# Patient Record
Sex: Female | Born: 1980 | Race: Black or African American | Hispanic: No | Marital: Married | Smoking: Never smoker
Health system: Southern US, Community
[De-identification: ages and names within clinical notes are randomized; demographics above are authoritative.]

## PROBLEM LIST (undated history)

## (undated) DIAGNOSIS — O24419 Gestational diabetes mellitus in pregnancy, unspecified control: Secondary | ICD-10-CM

## (undated) HISTORY — PX: FOOT SURGERY: SHX648

---

## 2007-01-14 ENCOUNTER — Ambulatory Visit: Payer: Self-pay | Admitting: *Deleted

## 2007-01-14 ENCOUNTER — Ambulatory Visit (HOSPITAL_COMMUNITY): Admission: RE | Admit: 2007-01-14 | Discharge: 2007-01-14 | Payer: Self-pay | Admitting: Family Medicine

## 2007-01-16 ENCOUNTER — Ambulatory Visit: Payer: Self-pay | Admitting: Gynecology

## 2007-01-21 ENCOUNTER — Ambulatory Visit: Payer: Self-pay | Admitting: Obstetrics & Gynecology

## 2007-01-23 ENCOUNTER — Ambulatory Visit: Payer: Self-pay | Admitting: Obstetrics and Gynecology

## 2007-01-25 ENCOUNTER — Inpatient Hospital Stay (HOSPITAL_COMMUNITY): Admission: AD | Admit: 2007-01-25 | Discharge: 2007-01-27 | Payer: Self-pay | Admitting: Obstetrics & Gynecology

## 2007-01-25 ENCOUNTER — Ambulatory Visit: Payer: Self-pay | Admitting: Family Medicine

## 2007-03-05 ENCOUNTER — Ambulatory Visit: Payer: Self-pay | Admitting: Gynecology

## 2007-03-05 ENCOUNTER — Encounter (INDEPENDENT_AMBULATORY_CARE_PROVIDER_SITE_OTHER): Payer: Self-pay | Admitting: Gynecology

## 2007-04-22 ENCOUNTER — Ambulatory Visit: Payer: Self-pay | Admitting: Obstetrics and Gynecology

## 2008-05-17 ENCOUNTER — Other Ambulatory Visit: Admission: RE | Admit: 2008-05-17 | Discharge: 2008-05-17 | Payer: Self-pay | Admitting: Family Medicine

## 2008-05-23 ENCOUNTER — Ambulatory Visit (HOSPITAL_COMMUNITY): Admission: RE | Admit: 2008-05-23 | Discharge: 2008-05-23 | Payer: Self-pay | Admitting: Family Medicine

## 2009-06-30 ENCOUNTER — Other Ambulatory Visit: Admission: RE | Admit: 2009-06-30 | Discharge: 2009-06-30 | Payer: Self-pay | Admitting: Family Medicine

## 2010-05-20 ENCOUNTER — Ambulatory Visit: Payer: Self-pay | Admitting: Obstetrics and Gynecology

## 2010-05-20 ENCOUNTER — Inpatient Hospital Stay (HOSPITAL_COMMUNITY): Admission: AD | Admit: 2010-05-20 | Discharge: 2010-05-20 | Payer: Self-pay | Admitting: Obstetrics and Gynecology

## 2010-05-22 ENCOUNTER — Inpatient Hospital Stay (HOSPITAL_COMMUNITY): Admission: AD | Admit: 2010-05-22 | Discharge: 2010-05-22 | Payer: Self-pay | Admitting: Obstetrics & Gynecology

## 2010-05-22 ENCOUNTER — Ambulatory Visit: Payer: Self-pay | Admitting: Nurse Practitioner

## 2010-10-02 DIAGNOSIS — O24419 Gestational diabetes mellitus in pregnancy, unspecified control: Secondary | ICD-10-CM

## 2010-10-02 HISTORY — DX: Gestational diabetes mellitus in pregnancy, unspecified control: O24.419

## 2010-11-08 LAB — URINALYSIS, ROUTINE W REFLEX MICROSCOPIC
Glucose, UA: NEGATIVE mg/dL
Leukocytes, UA: NEGATIVE
Protein, ur: NEGATIVE mg/dL
pH: 7 (ref 5.0–8.0)

## 2010-11-08 LAB — URINE MICROSCOPIC-ADD ON

## 2010-11-08 LAB — POCT PREGNANCY, URINE: Preg Test, Ur: POSITIVE

## 2010-11-08 LAB — CBC
Hemoglobin: 13.9 g/dL (ref 12.0–15.0)
MCH: 30.3 pg (ref 26.0–34.0)
MCHC: 33.8 g/dL (ref 30.0–36.0)
Platelets: 237 10*3/uL (ref 150–400)
RDW: 13.5 % (ref 11.5–15.5)

## 2010-11-08 LAB — GC/CHLAMYDIA PROBE AMP, GENITAL
Chlamydia, DNA Probe: NEGATIVE
GC Probe Amp, Genital: NEGATIVE

## 2010-11-08 LAB — HCG, QUANTITATIVE, PREGNANCY: hCG, Beta Chain, Quant, S: 1597 m[IU]/mL — ABNORMAL HIGH (ref <5)

## 2010-11-08 LAB — ABO/RH: ABO/RH(D): B POS

## 2011-01-04 LAB — HEPATITIS B SURFACE ANTIGEN: Hepatitis B Surface Ag: NEGATIVE

## 2011-01-04 LAB — HIV ANTIBODY (ROUTINE TESTING W REFLEX): HIV: NONREACTIVE

## 2011-01-04 LAB — ANTIBODY SCREEN: Antibody Screen: NEGATIVE

## 2011-01-08 NOTE — Group Therapy Note (Signed)
NAMETALYSSA, Kristina Clements NO.:  [id number]   MEDICAL RECORD NO.:  [id number]          PATIENT TYPE:  WOC   LOCATION:  [facility]                   FACILITY:  [facility]   PHYSICIAN:  Ginger Carne, MD DATE OF BIRTH:  08-23-1981   DATE OF SERVICE:  03/05/2007                                  CLINIC NOTE   HISTORY:  The patient is here today for a 6-week postpartum visit  following normal spontaneous vaginal delivery.  The patient has no  specific complaints and has scant spotting.  She requested an  intrauterine device.  Her urine pregnancy test is negative.   PHYSICAL EXAMINATION:  PELVIC:  External genitalia, vulva and vagina are  normal.  Cervix is smooth without erosions or lesions.  Pap smear  performed.  IUD placed without.   RECOMMENDATION:  Patient advised to contact the office for difficulties  including increasing cramping or abnormal bleeding.  The patient  understands.  IUD has contraceptive effect for 5 years.           ______________________________  Ginger Carne, MD     SHB/MEDQ  D:  03/05/2007  T:  03/06/2007  Job:  [id number]

## 2011-06-11 LAB — POCT PREGNANCY, URINE: Operator id: 148111

## 2011-06-13 LAB — CBC
HCT: 43
Hemoglobin: 14.4
MCV: 88
Platelets: 178
RDW: 14.8 — ABNORMAL HIGH

## 2011-06-19 ENCOUNTER — Ambulatory Visit: Payer: Self-pay

## 2011-07-22 ENCOUNTER — Observation Stay (HOSPITAL_COMMUNITY)
Admission: AD | Admit: 2011-07-22 | Discharge: 2011-07-23 | Disposition: A | Discharge status: Home / Self Care | Payer: 59 | Source: Ambulatory Visit | Attending: Obstetrics and Gynecology | Admitting: Obstetrics and Gynecology

## 2011-07-22 ENCOUNTER — Encounter (HOSPITAL_COMMUNITY): Payer: Self-pay | Admitting: *Deleted

## 2011-07-22 DIAGNOSIS — O4700 False labor before 37 completed weeks of gestation, unspecified trimester: Secondary | ICD-10-CM | POA: Insufficient documentation

## 2011-07-22 DIAGNOSIS — O99810 Abnormal glucose complicating pregnancy: Secondary | ICD-10-CM | POA: Insufficient documentation

## 2011-07-22 DIAGNOSIS — O3660X0 Maternal care for excessive fetal growth, unspecified trimester, not applicable or unspecified: Secondary | ICD-10-CM | POA: Insufficient documentation

## 2011-07-22 HISTORY — DX: Gestational diabetes mellitus in pregnancy, unspecified control: O24.419

## 2011-07-22 LAB — COMPREHENSIVE METABOLIC PANEL
Albumin: 2.7 g/dL — ABNORMAL LOW (ref 3.5–5.2)
Alkaline Phosphatase: 162 U/L — ABNORMAL HIGH (ref 39–117)
BUN: 6 mg/dL (ref 6–23)
CO2: 21 mEq/L (ref 19–32)
Chloride: 104 mEq/L (ref 96–112)
Creatinine, Ser: 0.62 mg/dL (ref 0.50–1.10)
GFR calc non Af Amer: >90 mL/min (ref >90)
Potassium: 3.6 mEq/L (ref 3.5–5.1)
Total Bilirubin: 0.3 mg/dL (ref 0.3–1.2)

## 2011-07-22 LAB — CBC
HCT: 41 % (ref 36.0–46.0)
MCHC: 33.2 g/dL (ref 30.0–36.0)
Platelets: 189 10*3/uL (ref 150–400)
RDW: 15 % (ref 11.5–15.5)
WBC: 10 10*3/uL (ref 4.0–10.5)

## 2011-07-22 MED ORDER — LACTATED RINGERS IV SOLN
INTRAVENOUS | Status: DC
  Administered 2011-07-22: 1000 mL via INTRAVENOUS
  Administered 2011-07-23: 08:00:00 via INTRAVENOUS

## 2011-07-22 MED ORDER — PENICILLIN G POTASSIUM 5000000 UNITS IJ SOLR
5.0000 10*6.[IU] | Freq: Once | INTRAVENOUS | Status: AC
  Administered 2011-07-22: 5 10*6.[IU] via INTRAVENOUS
  Filled 2011-07-22: qty 5

## 2011-07-22 MED ORDER — OXYTOCIN 20 UNITS IN LACTATED RINGERS INFUSION - SIMPLE: 125.0000 mL/h | Freq: Once | INTRAVENOUS | Status: DC

## 2011-07-22 MED ORDER — OXYCODONE-ACETAMINOPHEN 5-325 MG PO TABS: 2.0000 | ORAL_TABLET | ORAL | Status: DC | PRN

## 2011-07-22 MED ORDER — CITRIC ACID-SODIUM CITRATE 334-500 MG/5ML PO SOLN: 30.0000 mL | ORAL | Status: DC | PRN

## 2011-07-22 MED ORDER — ONDANSETRON HCL 4 MG/2ML IJ SOLN: 4.0000 mg | Freq: Four times a day (QID) | INTRAMUSCULAR | Status: DC | PRN

## 2011-07-22 MED ORDER — PENICILLIN G POTASSIUM 5000000 UNITS IJ SOLR
2.5000 10*6.[IU] | INTRAVENOUS | Status: DC
  Administered 2011-07-23 (×2): 2.5 10*6.[IU] via INTRAVENOUS
  Filled 2011-07-22 (×4): qty 2.5

## 2011-07-22 MED ORDER — LACTATED RINGERS IV SOLN: 500.0000 mL | INTRAVENOUS | Status: DC | PRN

## 2011-07-22 MED ORDER — LIDOCAINE HCL (PF) 1 % IJ SOLN: 30.0000 mL | INTRAMUSCULAR | Status: DC | PRN

## 2011-07-22 MED ORDER — ZOLPIDEM TARTRATE 10 MG PO TABS: 10.0000 mg | ORAL_TABLET | Freq: Every evening | ORAL | Status: DC | PRN

## 2011-07-22 MED ORDER — OXYTOCIN BOLUS FROM INFUSION
500.0000 mL | Freq: Once | INTRAVENOUS | Status: DC
  Filled 2011-07-22: qty 500

## 2011-07-22 MED ORDER — FLEET ENEMA 7-19 GM/118ML RE ENEM: 1.0000 | ENEMA | RECTAL | Status: DC | PRN

## 2011-07-22 MED ORDER — BUTORPHANOL TARTRATE 2 MG/ML IJ SOLN: 1.0000 mg | INTRAMUSCULAR | Status: DC | PRN

## 2011-07-22 MED ORDER — IBUPROFEN 600 MG PO TABS: 600.0000 mg | ORAL_TABLET | Freq: Four times a day (QID) | ORAL | Status: DC | PRN

## 2011-07-22 MED ORDER — ACETAMINOPHEN 325 MG PO TABS: 650.0000 mg | ORAL_TABLET | ORAL | Status: DC | PRN

## 2011-07-22 NOTE — ED Notes (Signed)
Directly to rm for eval

## 2011-07-22 NOTE — Progress Notes (Signed)
Dr. Rana Snare notified of repeat VE. Notified of ctx pattern.  Notified of pt discomfort with contractions but none without.  Orders to give pt option to be 23 hour obs or dc home and return if contractions worsen.

## 2011-07-22 NOTE — Progress Notes (Signed)
Pt may go to room 161.

## 2011-07-22 NOTE — H&P (Signed)
Kristina Clements is a 30 y.o. female presenting for evaluation of labor sxs.  Her pregnancy is complicated by A1DM with good control but [place] today in office showed 9+6 lb baby. Ctxs every 3-8 minutes that are mild to mod but vary. History OB History    Grav Para Term Preterm Abortions TAB SAB Ect Mult Living   4 1 1  0 2 0 2 0 0 1     Past Medical History  Diagnosis Date  . Gestational diabetes    Past Surgical History  Procedure Date  . Foot surgery    Family History: family history includes Diabetes in her father and mother and Hypertension in her mother. Social History:  reports that she has never smoked. She does not have any smokeless tobacco history on file. She reports that she does not drink alcohol or use illicit drugs.  ROS  Dilation: 5.5 Effacement (%): 90 Station: -2 Exam by:: N Deal RN Blood pressure 119/74, pulse 92, temperature 99.2 F (37.3 C), temperature source Axillary, resp. rate 18, height 5\' 9"  (1.753 m), weight 110.224 kg (243 lb). Exam Physical Exam  Per RN fetal vertex is not well applied with membranes at 5 cm with ctx Pt appears to handle the ctxs very well and does not appear to  Be in distress Prenatal labs: ABO, Rh:   Antibody: Negative (05/11 0000) Rubella: Immune (05/11 0000) RPR: Nonreactive (05/11 0000)  HBsAg: Negative (05/11 0000)  HIV: Non-reactive (05/11 0000)  GBS: Positive (10/24 0000)   Assessment/Plan: IUP at 36 weeks Gest DM and macrosomia - I have discussed risks of shoulder dytocia but also of prematurity She really has not had any cervical change and the ctxs appear to be mild.  Pt desires to be monitored tonight in case she progresses. I discussed that AROM is not practical due to high fetal vertex, and pitocin would not be justified due to prematurity.  Plan to monitor, and if progresses will admit.  If not, will dc home.  Pt wants to delivery vaginally.   Philomena Buttermore C 07/22/2011, 11:51 PM

## 2011-07-22 NOTE — Progress Notes (Signed)
Dr. Rana Snare notified of pt presenting for labor check.  Notified of VE and ctx pattern.  Notified of 36.3 weeks with GBS +.  Orders to po hydrate and recheck cervix in one hour.

## 2011-07-22 NOTE — Progress Notes (Signed)
poc discussed with pt.  Pt wishes to stay for 23 hour obs.

## 2011-07-23 LAB — RPR: RPR Ser Ql: NONREACTIVE

## 2011-07-23 NOTE — Progress Notes (Signed)
Pt slept through most of night VSSAF Ctx q 10-20 minutes mild FHR 140s with accel  Cx 4/90/-3  IUP at 36 + weeks False labor, after extended observation FU in office 1 week DL

## 2011-07-24 ENCOUNTER — Encounter (HOSPITAL_COMMUNITY): Payer: Self-pay | Admitting: Anesthesiology

## 2011-07-24 ENCOUNTER — Encounter (HOSPITAL_COMMUNITY): Payer: Self-pay | Admitting: *Deleted

## 2011-07-24 ENCOUNTER — Inpatient Hospital Stay (HOSPITAL_COMMUNITY): Payer: 59 | Admitting: Anesthesiology

## 2011-07-24 ENCOUNTER — Inpatient Hospital Stay (HOSPITAL_COMMUNITY)
Admission: AD | Admit: 2011-07-24 | Discharge: 2011-07-27 | DRG: 767 | Disposition: A | Discharge status: Home / Self Care | Payer: 59 | Source: Ambulatory Visit | Attending: Obstetrics and Gynecology | Admitting: Obstetrics and Gynecology

## 2011-07-24 ENCOUNTER — Other Ambulatory Visit: Payer: Self-pay | Admitting: Obstetrics and Gynecology

## 2011-07-24 DIAGNOSIS — Z302 Encounter for sterilization: Secondary | ICD-10-CM

## 2011-07-24 DIAGNOSIS — Z22330 Carrier of Group B streptococcus: Secondary | ICD-10-CM

## 2011-07-24 DIAGNOSIS — O99892 Other specified diseases and conditions complicating childbirth: Secondary | ICD-10-CM | POA: Diagnosis present

## 2011-07-24 LAB — CBC
HCT: 36.6 % (ref 36.0–46.0)
Hemoglobin: 12.1 g/dL (ref 12.0–15.0)
MCH: 29.1 pg (ref 26.0–34.0)
MCHC: 33.1 g/dL (ref 30.0–36.0)
MCV: 88 fL (ref 78.0–100.0)
RDW: 14.8 % (ref 11.5–15.5)

## 2011-07-24 MED ORDER — LIDOCAINE HCL (PF) 1 % IJ SOLN
30.0000 mL | INTRAMUSCULAR | Status: DC | PRN
  Filled 2011-07-24: qty 30

## 2011-07-24 MED ORDER — ACETAMINOPHEN 325 MG PO TABS: 650.0000 mg | ORAL_TABLET | ORAL | Status: DC | PRN

## 2011-07-24 MED ORDER — LACTATED RINGERS IV SOLN: 500.0000 mL | Freq: Once | INTRAVENOUS | Status: DC

## 2011-07-24 MED ORDER — DIPHENHYDRAMINE HCL 50 MG/ML IJ SOLN: 12.5000 mg | INTRAMUSCULAR | Status: DC | PRN

## 2011-07-24 MED ORDER — OXYTOCIN BOLUS FROM INFUSION
500.0000 mL | Freq: Once | INTRAVENOUS | Status: DC
  Filled 2011-07-24: qty 1000
  Filled 2011-07-24: qty 500

## 2011-07-24 MED ORDER — EPHEDRINE 5 MG/ML INJ: 10.0000 mg | INTRAVENOUS | Status: DC | PRN

## 2011-07-24 MED ORDER — EPHEDRINE 5 MG/ML INJ
10.0000 mg | INTRAVENOUS | Status: DC | PRN
  Filled 2011-07-24: qty 4

## 2011-07-24 MED ORDER — PHENYLEPHRINE 40 MCG/ML (10ML) SYRINGE FOR IV PUSH (FOR BLOOD PRESSURE SUPPORT): 80.0000 ug | PREFILLED_SYRINGE | INTRAVENOUS | Status: DC | PRN

## 2011-07-24 MED ORDER — PHENYLEPHRINE 40 MCG/ML (10ML) SYRINGE FOR IV PUSH (FOR BLOOD PRESSURE SUPPORT)
80.0000 ug | PREFILLED_SYRINGE | INTRAVENOUS | Status: DC | PRN
  Filled 2011-07-24: qty 5

## 2011-07-24 MED ORDER — LIDOCAINE HCL 1.5 % IJ SOLN
INTRAMUSCULAR | Status: DC | PRN
  Administered 2011-07-24 (×2): 5 mL via EPIDURAL

## 2011-07-24 MED ORDER — CITRIC ACID-SODIUM CITRATE 334-500 MG/5ML PO SOLN: 30.0000 mL | ORAL | Status: DC | PRN

## 2011-07-24 MED ORDER — LACTATED RINGERS IV SOLN: 500.0000 mL | INTRAVENOUS | Status: DC | PRN

## 2011-07-24 MED ORDER — ONDANSETRON HCL 4 MG/2ML IJ SOLN: 4.0000 mg | Freq: Four times a day (QID) | INTRAMUSCULAR | Status: DC | PRN

## 2011-07-24 MED ORDER — IBUPROFEN 600 MG PO TABS: 600.0000 mg | ORAL_TABLET | Freq: Four times a day (QID) | ORAL | Status: DC | PRN

## 2011-07-24 MED ORDER — FENTANYL 2.5 MCG/ML BUPIVACAINE 1/10 % EPIDURAL INFUSION (WH - ANES)
14.0000 mL/h | INTRAMUSCULAR | Status: DC
  Administered 2011-07-24: 14 mL/h via EPIDURAL
  Filled 2011-07-24 (×2): qty 60

## 2011-07-24 MED ORDER — FENTANYL 2.5 MCG/ML BUPIVACAINE 1/10 % EPIDURAL INFUSION (WH - ANES)
INTRAMUSCULAR | Status: DC | PRN
  Administered 2011-07-24: 14 mL/h via EPIDURAL

## 2011-07-24 MED ORDER — IBUPROFEN 800 MG PO TABS
800.0000 mg | ORAL_TABLET | Freq: Three times a day (TID) | ORAL | Status: DC | PRN
  Administered 2011-07-24 – 2011-07-27 (×8): 800 mg via ORAL
  Filled 2011-07-24 (×10): qty 1

## 2011-07-24 MED ORDER — LACTATED RINGERS IV SOLN
INTRAVENOUS | Status: DC
  Administered 2011-07-24 (×3): via INTRAVENOUS

## 2011-07-24 MED ORDER — SODIUM CHLORIDE 0.9 % IV SOLN
2.0000 g | Freq: Once | INTRAVENOUS | Status: DC
  Administered 2011-07-24: 2 g via INTRAVENOUS
  Filled 2011-07-24: qty 2000

## 2011-07-24 MED ORDER — OXYTOCIN 20 UNITS IN LACTATED RINGERS INFUSION - SIMPLE
125.0000 mL/h | Freq: Once | INTRAVENOUS | Status: DC
  Administered 2011-07-24: 125 mL/h via INTRAVENOUS

## 2011-07-24 MED ORDER — FLEET ENEMA 7-19 GM/118ML RE ENEM: 1.0000 | ENEMA | RECTAL | Status: DC | PRN

## 2011-07-24 MED ORDER — OXYCODONE-ACETAMINOPHEN 5-325 MG PO TABS
2.0000 | ORAL_TABLET | ORAL | Status: DC | PRN
Start: 2011-07-24 — End: 2011-07-24

## 2011-07-24 NOTE — H&P (Signed)
Kristina Clements is a 30 y.o. female presenting for advanced labor @  36 + weeks gestatiuon. Maternal Medical History:  Reason for admission: Reason for admission: contractions.  Contractions: Onset was 3-5 hours ago.   Frequency: regular.   Perceived severity is moderate.    Fetal activity: Perceived fetal activity is normal.      OB History    Grav Para Term Preterm Abortions TAB SAB Ect Mult Living   4 1 1  0 2 0 2 0 0 1     Past Medical History  Diagnosis Date  . Gestational diabetes    Past Surgical History  Procedure Date  . Foot surgery    Family History: family history includes Diabetes in her father and mother and Hypertension in her mother. Social History:  reports that she has never smoked. She does not have any smokeless tobacco history on file. She reports that she does not drink alcohol or use illicit drugs.  ROS  Dilation: 7 Effacement (%): 80 Station: Ballotable Exam by:: Cletis Media, RN There were no vitals taken for this visit. Maternal Exam:  Uterine Assessment: Contraction strength is moderate.  Contraction frequency is regular.   Abdomen: Patient reports no abdominal tenderness. Cervix: Cervix evaluated by digital exam.     Physical Exam  Constitutional: She is oriented to person, place, and time. She appears well-developed and well-nourished.  HENT:  Head: Normocephalic.  Neck: Normal range of motion. Neck supple.  Cardiovascular: Normal rate and regular rhythm.   Respiratory: Effort normal and breath sounds normal.  GI: Soft.  Genitourinary:       cx 7 cm/BOW I  Neurological: She is alert and oriented to person, place, and time.    Prenatal labs: ABO, Rh:   Antibody: Negative (05/11 0000) Rubella: Immune (05/11 0000) RPR: NON REACTIVE (11/26 2130)  HBsAg: Negative (05/11 0000)  HIV: Non-reactive (05/11 0000)  GBS: Positive (10/24 0000)   Assessment/Plan: 36 + wk IUP, Hx gest DM, + GBS For epidural + IV Ampicillin   Kristina Clements  M 07/24/2011, 3:24 PM

## 2011-07-24 NOTE — Progress Notes (Signed)
Delivery note   This patient had good progress in first stage labor to spontaneous vaginal delivery of the vertex, there was retraction of vertex against the perineum immediately. Help was called for that point from the nursing staff. Initially, the legs are retracted, McRoberts maneuver with moderate downward traction and suprapubic pressure resulted in no movement of the anterior shoulder. A second maneuver I tried was the corkscrew maneuver, I could not get the anterior shoulde rto rotate in either direction.At  This point I quickly decided to make episiotomy, she had a good epidural and I was able to sweep  the posterior arm across the chest, the clavicle may have fractured during that process and the arm  delivered easily, then  after the posterior arm was delivered, moderate downward traction to effect delivery the anterior shoulder. The NICU team was called at that point for resuscitation. They assigned Apgars and cord pH was sent. Placenta delivered spontaneously intact, a partial third degree extension of a episiotomy was repaired in standard layer fashion EBL 400 cc no other lacerations were noted.   The vertex to body delivery time was approximately 2-1/2 minutes. PH was 7.253 a 10 lbs. 13 oz. Female. There is respiratory effort improved quickly, was having some movement of hands and wrists but slow to move both upper extremities initially. Mother is interested in postpartum tubal ligation which we discussed briefly, we'll keep her n.p.o. And discussed with her in the morning regarding scheduling a time [facility]. Milana Obey.D.

## 2011-07-24 NOTE — Progress Notes (Signed)
Pt in for labor eval, was sent home from birthing suites yesterday due to no change of cervix.  Denies any bleeding, reports mucus discharge. +FM.

## 2011-07-24 NOTE — Progress Notes (Signed)
7/C/-1  AROM>>clear, rec'd Amp, for Epidural

## 2011-07-24 NOTE — Consult Note (Signed)
Neonatology Note:  Attendance at Code Apgar:  Our team responded to a Code Apgar call to room # 165 following vaginal delivery complicated by shoulder dystocia, due to infant with apnea. The mother is a G4P1A2 GBS pos with well-controlled GDM and known macrosomia. She had spontaneous onset of labor at 36 weeks and ROM occurred 3.5 hours PTD; the fluid was clear. The mother received Ampicillin beginning 4.5 hours PTD. Her maximum temp was 99.2 degrees. The baby's head delivered spontaneously, then the body about 2 minutes later after maneuvers. The baby was blue, flaccid, and apneic and a Code Apgar was called. Our team arrived at 1 minute of life, at which time the HR was about 50. The OB nursing staff in attendance were giving vigorous stimulation. We did quick bulb suctioning, then applied PPV for 2 minutes. The HR rapidly came up to normal and color improved promptly. The baby began to breathe on her own at 3 minutes of life and cried at about 4 minutes. Her tone remained very poor, with only minimal flexion of the lower extremities at 5 minutes. Ap 1/8. By 10 minutes, her legs had good tone, but the arms continued to lie flat on the bed; there was a minimal grasp reflex of the left hand, while the right hand had a grasp reflex and minimal flexion at the wrist. I could not palpate a clavicle fracture, but Dr. Holland said he thought he had felt a snap at delivery. I discussed these findings with the parents, letting them know that it might be too early to say for certain, but that brachial plexus injury was likely. Recovery from stretch injury to these nerves may take up to several weeks and full recovery may not occur. Their Pediatrician will reassess the baby in the morning and obtain X-rays of the clavicles as indicated, then can give further diagnosis and prognosis. As the baby appears stable from the cardiopulmonary standpoint, I transferred the baby to the Pediatrician's care and informed the [facility]. C. Bethel Sirois, MD  

## 2011-07-24 NOTE — Anesthesia Procedure Notes (Signed)
Epidural Patient location during procedure: OB Start time: 07/24/2011 5:32 PM End time: 07/24/2011 5:38 PM Reason for block: procedure for pain  Staffing Anesthesiologist: Sandrea Hughs Performed by: anesthesiologist   Preanesthetic Checklist Completed: patient identified, site marked, surgical consent, pre-op evaluation, timeout performed, IV checked, risks and benefits discussed and monitors and equipment checked  Epidural Patient position: sitting Prep: site prepped and draped and DuraPrep Patient monitoring: continuous pulse ox and blood pressure Approach: midline Injection technique: LOR air  Needle:  Needle type: Tuohy  Needle gauge: 17 G Needle length: 9 cm Needle insertion depth: 6 cm Catheter type: closed end flexible Catheter size: 19 Gauge Catheter at skin depth: 11 cm Test dose: negative and 1.5% lidocaine  Assessment Sensory level: T10 Events: blood not aspirated, injection not painful, no injection resistance, negative IV test and no paresthesia

## 2011-07-24 NOTE — Anesthesia Preprocedure Evaluation (Signed)
Anesthesia Evaluation  Patient identified by MRN, date of birth, ID band Patient awake    Reviewed: Allergy & Precautions, H&P , NPO status , Patient's Chart, lab work & pertinent test results  Airway Mallampati: I TM Distance: >3 FB Neck ROM: full    Dental No notable dental hx.    Pulmonary neg pulmonary ROS,    Pulmonary exam normal       Cardiovascular neg cardio ROS     Neuro/Psych Negative Neurological ROS  Negative Psych ROS   GI/Hepatic negative GI ROS, Neg liver ROS,   Endo/Other  Diabetes mellitus-, GestationalMorbid obesity  Renal/GU negative Renal ROS  Genitourinary negative   Musculoskeletal negative musculoskeletal ROS (+)   Abdominal (+) obese,   Peds negative pediatric ROS (+)  Hematology negative hematology ROS (+)   Anesthesia Other Findings   Reproductive/Obstetrics (+) Pregnancy                           Anesthesia Physical Anesthesia Plan  ASA: III  Anesthesia Plan: Epidural   Post-op Pain Management:    Induction:   Airway Management Planned:   Additional Equipment:   Intra-op Plan:   Post-operative Plan:   Informed Consent: I have reviewed the patients History and Physical, chart, labs and discussed the procedure including the risks, benefits and alternatives for the proposed anesthesia with the patient or authorized representative who has indicated his/her understanding and acceptance.     Plan Discussed with:   Anesthesia Plan Comments:         Anesthesia Quick Evaluation

## 2011-07-25 ENCOUNTER — Encounter (HOSPITAL_COMMUNITY): Payer: Self-pay | Admitting: *Deleted

## 2011-07-25 LAB — SURGICAL PCR SCREEN: MRSA, PCR: NEGATIVE

## 2011-07-25 LAB — CBC
Hemoglobin: 10.6 g/dL — ABNORMAL LOW (ref 12.0–15.0)
MCH: 29.6 pg (ref 26.0–34.0)
MCHC: 33.9 g/dL (ref 30.0–36.0)
MCV: 87.4 fL (ref 78.0–100.0)
RBC: 3.58 MIL/uL — ABNORMAL LOW (ref 3.87–5.11)

## 2011-07-25 LAB — GLUCOSE, CAPILLARY: Glucose-Capillary: 44 mg/dL — CL (ref 70–99)

## 2011-07-25 MED ORDER — FAMOTIDINE 20 MG PO TABS
40.0000 mg | ORAL_TABLET | Freq: Once | ORAL | Status: AC
  Administered 2011-07-26: 40 mg via ORAL
  Filled 2011-07-25: qty 2

## 2011-07-25 MED ORDER — SIMETHICONE 80 MG PO CHEW
80.0000 mg | CHEWABLE_TABLET | ORAL | Status: DC | PRN
  Administered 2011-07-27: 80 mg via ORAL

## 2011-07-25 MED ORDER — ONDANSETRON HCL 4 MG PO TABS: 4.0000 mg | ORAL_TABLET | ORAL | Status: DC | PRN

## 2011-07-25 MED ORDER — DIBUCAINE 1 % RE OINT: 1.0000 "application " | TOPICAL_OINTMENT | RECTAL | Status: DC | PRN

## 2011-07-25 MED ORDER — ZOLPIDEM TARTRATE 5 MG PO TABS: 5.0000 mg | ORAL_TABLET | Freq: Every evening | ORAL | Status: DC | PRN

## 2011-07-25 MED ORDER — DIPHENHYDRAMINE HCL 25 MG PO CAPS: 25.0000 mg | ORAL_CAPSULE | Freq: Four times a day (QID) | ORAL | Status: DC | PRN

## 2011-07-25 MED ORDER — FLEET ENEMA 7-19 GM/118ML RE ENEM: 1.0000 | ENEMA | RECTAL | Status: DC | PRN

## 2011-07-25 MED ORDER — OXYCODONE-ACETAMINOPHEN 5-325 MG PO TABS
1.0000 | ORAL_TABLET | Freq: Four times a day (QID) | ORAL | Status: DC | PRN
  Administered 2011-07-26 – 2011-07-27 (×2): 1 via ORAL
  Filled 2011-07-25 (×2): qty 1

## 2011-07-25 MED ORDER — LANOLIN HYDROUS EX OINT: TOPICAL_OINTMENT | CUTANEOUS | Status: DC | PRN

## 2011-07-25 MED ORDER — MEASLES, MUMPS & RUBELLA VAC ~~LOC~~ INJ
0.5000 mL | INJECTION | Freq: Once | SUBCUTANEOUS | Status: DC
  Filled 2011-07-25: qty 0.5

## 2011-07-25 MED ORDER — TETANUS-DIPHTH-ACELL PERTUSSIS 5-2.5-18.5 LF-MCG/0.5 IM SUSP: 0.5000 mL | Freq: Once | INTRAMUSCULAR | Status: DC

## 2011-07-25 MED ORDER — BENZOCAINE-MENTHOL 20-0.5 % EX AERO
1.0000 "application " | INHALATION_SPRAY | CUTANEOUS | Status: DC | PRN
  Administered 2011-07-25 – 2011-07-27 (×2): 1 via TOPICAL

## 2011-07-25 MED ORDER — ONDANSETRON HCL 4 MG/2ML IJ SOLN: 4.0000 mg | INTRAMUSCULAR | Status: DC | PRN

## 2011-07-25 MED ORDER — BISACODYL 10 MG RE SUPP: 10.0000 mg | Freq: Every day | RECTAL | Status: DC | PRN

## 2011-07-25 MED ORDER — LACTATED RINGERS IV SOLN: INTRAVENOUS | Status: DC

## 2011-07-25 MED ORDER — METOCLOPRAMIDE HCL 10 MG PO TABS
10.0000 mg | ORAL_TABLET | Freq: Once | ORAL | Status: AC
  Administered 2011-07-26: 10 mg via ORAL
  Filled 2011-07-25: qty 1

## 2011-07-25 MED ORDER — WITCH HAZEL-GLYCERIN EX PADS: 1.0000 "application " | MEDICATED_PAD | CUTANEOUS | Status: DC | PRN

## 2011-07-25 MED ORDER — PRENATAL PLUS 27-1 MG PO TABS
1.0000 | ORAL_TABLET | Freq: Every day | ORAL | Status: DC
  Administered 2011-07-27: 1 via ORAL
  Filled 2011-07-25 (×2): qty 1

## 2011-07-25 MED ORDER — SENNOSIDES-DOCUSATE SODIUM 8.6-50 MG PO TABS
2.0000 | ORAL_TABLET | Freq: Every day | ORAL | Status: DC
  Administered 2011-07-25 – 2011-07-26 (×2): 2 via ORAL

## 2011-07-25 MED ORDER — BENZOCAINE-MENTHOL 20-0.5 % EX AERO
INHALATION_SPRAY | CUTANEOUS | Status: AC
  Administered 2011-07-25: 23:00:00
  Filled 2011-07-25: qty 56

## 2011-07-25 NOTE — Progress Notes (Signed)
Dr. Henderson Cloud II notified about the change in patient's request for bilateral tubal ligation. At this time Dr. Henderson Cloud II mention that it is ok for patient to eat and the tubal ligation will be schedule for tomorrow. RN will monitor for now.

## 2011-07-25 NOTE — Anesthesia Postprocedure Evaluation (Signed)
Anesthesia Post Note  Patient: Kristina Clements  Procedure(s) Performed: * No procedures listed *  Anesthesia type: Epidural  Patient location: Mother/Baby  Post pain: Pain level controlled  Post assessment: Post-op Vital signs reviewed  Last Vitals:  Filed Vitals:   07/25/11 0440  BP: 111/68  Pulse: 91  Temp: 36.3 C  Resp: 20    Post vital signs: Reviewed  Level of consciousness: awake  Complications: No apparent anesthesia complications 

## 2011-07-25 NOTE — Addendum Note (Signed)
Addendum  created 07/25/11 0945 by Doreene Burke   Modules edited:Charges VN, Notes Section

## 2011-07-25 NOTE — Progress Notes (Signed)
Post Partum Day 1 Subjective: no complaints, up ad lib and desires btl. Baby in NICU  Objective: Blood pressure 111/68, pulse 91, temperature 97.4 F (36.3 C), temperature source Oral, resp. rate 20, height 5\' 10"  (1.778 m), weight 108.863 kg (240 lb), unknown if currently breastfeeding.  Physical Exam:  General: alert and cooperative Lochia: appropriate Uterine Fundus: firm Perineum intact DVT Evaluation: No evidence of DVT seen on physical exam.   Basename 07/25/11 0545 07/24/11 1541  HGB 10.6* 12.1  HCT 31.3* 36.6    Assessment/Plan: Plan for discharge tomorrow Scheduling BTL   LOS: 1 day   Dora Clauss G 07/25/2011, 8:01 AM

## 2011-07-25 NOTE — Progress Notes (Signed)
D/W pt BTL-risks, failure rate, permanence, increased ectopic risk. All questions answered.

## 2011-07-25 NOTE — Anesthesia Postprocedure Evaluation (Signed)
Anesthesia Post Note  Patient: Kristina Clements  Procedure(s) Performed: * No procedures listed *  Anesthesia type: Epidural  Patient location: Mother/Baby  Post pain: Pain level controlled  Post assessment: Post-op Vital signs reviewed  Last Vitals:  Filed Vitals:   07/25/11 0440  BP: 111/68  Pulse: 91  Temp: 36.3 C  Resp: 20    Post vital signs: Reviewed  Level of consciousness: awake  Complications: No apparent anesthesia complications

## 2011-07-25 NOTE — Progress Notes (Signed)
PSYCHOSOCIAL ASSESSMENT ~ MATERNAL/CHILD Name: Kristina Clements                                                                                          Age: 30  Referral Date: 07/25/11 Reason/Source: NICU support/NICU  I. FAMILY/HOME ENVIRONMENT A. Child's Legal Guardian __x_Parent(s) ___Grandparent ___Foster parent ___DSS_________________ Name: Kristina Clements                                  DOB: //                     Age: 30  Address: [address], [place], [place] [address]  Name: Kristina Clements                                     DOB: //                     Age:   Address: same  B. Other Household Members/Support Persons Name: Kristina Clements (4)                        Relationship: brother           DOB ___/___/___                   Name:                                         Relationship:                        DOB ___/___/___                   Name:                                         Relationship:                        DOB ___/___/___                   Name:                                         Relationship:                        DOB ___/___/___  C. Other Support: Good support system-MOB's father here with her today.   II. PSYCHOSOCIAL DATA A. Information Source                                                                                               _x_Patient Interview  __Family Interview           _x_Other: chart  B. 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 Cultural and Environment Information Cultural Issues Impacting Care:  none known  III. STRENGTHS _x__Supportive family/friends _x__Adequate Resources _x__Compliance with medical plan _x__Home prepared for Child (including basic supplies) _x__Understanding of illness      _x__Other: Pediatrician will be Dr. Pudlo IV. RISK FACTORS AND CURRENT PROBLEMS         __x__No Problems Noted                                                                                                                                                                                                                                       Pt              Family     Substance Abuse                                                                ___              ___        Mental Illness                                                                          ___              ___  Family/Relationship Issues                                      ___               ___             Abuse/Neglect/Domestic Violence                                         ___         ___  Financial Resources                                        ___              ___             Transportation                                                                        ___               ___  DSS Involvement                                                                   ___              ___  Adjustment to Illness                                                               ___              ___  Knowledge/Cognitive Deficit                                                   ___              ___             Compliance with Treatment                                                 ___                ___  Basic Needs (food, housing, etc.)                                          ___              ___             Housing Concerns                                       ___              ___ Other_____________________________________________________________            V. SOCIAL WORK ASSESSMENT SW met with MOB in her first floor room to  introduce myself, complete assessment and evaluate how she is coping with baby's admission to NICU.  MOB was extremely pleasant and enjoyable to talk with.  She states she and baby are doing well and that she hopes she will not have to leave her here at her discharge.  SW discussed the possible need for baby to stay in NICU longer than MOB is a patient and she is very understanding and states no issues with transportation if this happens.  She reports having a good support system and everything she needs for baby at home.  She states she will have 12 weeks off from work and FOB will have about a week off.  They have a four year old son at home.  She appears to be coping very well and have a good understanding of baby's condition and need for NICU care.  SW explained support services offered by NICU SWs and gave contact information.  MOB reports no questions or needs at this time and thanked SW for visiting.  VI. SOCIAL WORK PLAN  ___No Further Intervention Required/No Barriers to Discharge   __x_Psychosocial Support and Ongoing Assessment of Needs   ___Patient/Family Education:   ___Child Protective Services Report   County___________ Date___/____/____   ___Information/Referral to Community Resources_________________________   ___Other:        

## 2011-07-25 NOTE — Progress Notes (Signed)
Dr. Henderson Cloud was notified via telephone that patient was complaining of feeling weak and sick on the stomach. Patient's vitals are the following 121/78, pulse: 96, and temperature is 98.6. Also at this time patient blood sugar was within normal limits. Patient requested that her tubal be reschedule for tomorrow since she has had nothing to eat within the last two days. Orders were given. Rn will monitor for now.

## 2011-07-25 NOTE — Progress Notes (Signed)
Patient informed RN that she would rather have her bilateral tubal ligation in this hospital stay than have it outpatient. RN will notify Dr. Henderson Cloud II about the change in patient's request.

## 2011-07-26 ENCOUNTER — Encounter (HOSPITAL_COMMUNITY): Payer: Self-pay

## 2011-07-26 ENCOUNTER — Encounter (HOSPITAL_COMMUNITY)
Admission: AD | Disposition: A | Discharge status: Home / Self Care | Payer: Self-pay | Source: Ambulatory Visit | Attending: Obstetrics and Gynecology

## 2011-07-26 ENCOUNTER — Inpatient Hospital Stay (HOSPITAL_COMMUNITY): Payer: 59

## 2011-07-26 ENCOUNTER — Other Ambulatory Visit: Payer: Self-pay | Admitting: Obstetrics and Gynecology

## 2011-07-26 HISTORY — PX: TUBAL LIGATION: SHX77

## 2011-07-26 LAB — BASIC METABOLIC PANEL
BUN: 7 mg/dL (ref 6–23)
CO2: 21 mEq/L (ref 19–32)
Calcium: 9.4 mg/dL (ref 8.4–10.5)
Creatinine, Ser: 0.76 mg/dL (ref 0.50–1.10)
Glucose, Bld: 197 mg/dL — ABNORMAL HIGH (ref 70–99)
Sodium: 136 mEq/L (ref 135–145)

## 2011-07-26 LAB — CBC
HCT: 34.3 % — ABNORMAL LOW (ref 36.0–46.0)
Hemoglobin: 11.3 g/dL — ABNORMAL LOW (ref 12.0–15.0)
MCH: 29 pg (ref 26.0–34.0)
MCV: 88.2 fL (ref 78.0–100.0)
RBC: 3.89 MIL/uL (ref 3.87–5.11)

## 2011-07-26 LAB — GLUCOSE, CAPILLARY: Glucose-Capillary: 106 mg/dL — ABNORMAL HIGH (ref 70–99)

## 2011-07-26 SURGERY — LIGATION, FALLOPIAN TUBE, POSTPARTUM
Anesthesia: Choice | Laterality: Bilateral

## 2011-07-26 SURGERY — LIGATION, FALLOPIAN TUBE, POSTPARTUM
Anesthesia: Epidural | Site: Abdomen | Laterality: Bilateral | Wound class: Clean

## 2011-07-26 MED ORDER — BUPIVACAINE IN DEXTROSE 0.75-8.25 % IT SOLN
INTRATHECAL | Status: DC | PRN
  Administered 2011-07-26: 11 mg via INTRATHECAL

## 2011-07-26 MED ORDER — ONDANSETRON HCL 4 MG/2ML IJ SOLN
INTRAMUSCULAR | Status: AC
  Filled 2011-07-26: qty 2

## 2011-07-26 MED ORDER — PROMETHAZINE HCL 25 MG/ML IJ SOLN: 6.2500 mg | INTRAMUSCULAR | Status: DC | PRN

## 2011-07-26 MED ORDER — ACETAMINOPHEN 325 MG PO TABS: 325.0000 mg | ORAL_TABLET | ORAL | Status: DC | PRN

## 2011-07-26 MED ORDER — SODIUM BICARBONATE 8.4 % IV SOLN
INTRAVENOUS | Status: AC
  Filled 2011-07-26: qty 50

## 2011-07-26 MED ORDER — KETOROLAC TROMETHAMINE 30 MG/ML IJ SOLN: 30.0000 mg | Freq: Once | INTRAMUSCULAR | Status: DC

## 2011-07-26 MED ORDER — DEXAMETHASONE SODIUM PHOSPHATE 10 MG/ML IJ SOLN
INTRAMUSCULAR | Status: DC | PRN
  Administered 2011-07-26: 10 mg via INTRAVENOUS

## 2011-07-26 MED ORDER — FENTANYL CITRATE 0.05 MG/ML IJ SOLN: 25.0000 ug | INTRAMUSCULAR | Status: DC | PRN

## 2011-07-26 MED ORDER — LACTATED RINGERS IV SOLN
INTRAVENOUS | Status: DC | PRN
  Administered 2011-07-26 (×2): via INTRAVENOUS

## 2011-07-26 MED ORDER — LIDOCAINE-EPINEPHRINE (PF) 2 %-1:200000 IJ SOLN
INTRAMUSCULAR | Status: AC
  Filled 2011-07-26: qty 20

## 2011-07-26 MED ORDER — IBUPROFEN 600 MG PO TABS
600.0000 mg | ORAL_TABLET | Freq: Four times a day (QID) | ORAL | Status: DC | PRN
  Filled 2011-07-26: qty 1

## 2011-07-26 MED ORDER — PROMETHAZINE HCL 25 MG/ML IJ SOLN: 12.5000 mg | INTRAMUSCULAR | Status: DC | PRN

## 2011-07-26 MED ORDER — TEMAZEPAM 15 MG PO CAPS: 15.0000 mg | ORAL_CAPSULE | Freq: Every evening | ORAL | Status: DC | PRN

## 2011-07-26 MED ORDER — TRAMADOL HCL 50 MG PO TABS: 50.0000 mg | ORAL_TABLET | Freq: Four times a day (QID) | ORAL | Status: DC | PRN

## 2011-07-26 MED ORDER — SODIUM BICARBONATE 8.4 % IV SOLN
INTRAVENOUS | Status: DC | PRN
  Administered 2011-07-26: 2 mL via EPIDURAL

## 2011-07-26 MED ORDER — BUPIVACAINE HCL 0.25 % IJ SOLN
INTRAMUSCULAR | Status: DC | PRN
  Administered 2011-07-26: 9 mL

## 2011-07-26 MED ORDER — KETOROLAC TROMETHAMINE 30 MG/ML IJ SOLN: 15.0000 mg | Freq: Once | INTRAMUSCULAR | Status: DC | PRN

## 2011-07-26 MED ORDER — MENTHOL 3 MG MT LOZG: 1.0000 | LOZENGE | OROMUCOSAL | Status: DC | PRN

## 2011-07-26 MED ORDER — ONDANSETRON HCL 4 MG/2ML IJ SOLN
INTRAMUSCULAR | Status: DC | PRN
  Administered 2011-07-26: 4 mg via INTRAVENOUS

## 2011-07-26 MED ORDER — DEXAMETHASONE SODIUM PHOSPHATE 10 MG/ML IJ SOLN
INTRAMUSCULAR | Status: AC
  Filled 2011-07-26: qty 1

## 2011-07-26 SURGICAL SUPPLY — 21 items
ADH SKN CLS APL DERMABOND .7 (GAUZE/BANDAGES/DRESSINGS) ×1
CHLORAPREP W/TINT 26ML (MISCELLANEOUS) ×2 IMPLANT
CLOTH BEACON ORANGE TIMEOUT ST (SAFETY) ×2 IMPLANT
CONTAINER PREFILL 10% NBF 15ML (MISCELLANEOUS) ×4 IMPLANT
DERMABOND ADVANCED (GAUZE/BANDAGES/DRESSINGS) ×1
DERMABOND ADVANCED .7 DNX12 (GAUZE/BANDAGES/DRESSINGS) IMPLANT
GLOVE BIO SURGEON STRL SZ 6.5 (GLOVE) ×4 IMPLANT
GOWN PREVENTION PLUS LG XLONG (DISPOSABLE) ×4 IMPLANT
NDL HYPO 25X1 1.5 SAFETY (NEEDLE) IMPLANT
NEEDLE HYPO 25X1 1.5 SAFETY (NEEDLE) ×2 IMPLANT
NS IRRIG 1000ML POUR BTL (IV SOLUTION) ×2 IMPLANT
PACK ABDOMINAL MINOR (CUSTOM PROCEDURE TRAY) ×2 IMPLANT
SPONGE LAP 4X18 X RAY DECT (DISPOSABLE) ×1 IMPLANT
SUT PLAIN 0 NONE (SUTURE) ×2 IMPLANT
SUT VIC AB 2-0 CT1 (SUTURE) ×2 IMPLANT
SUT VIC AB 3-0 PS2 18 (SUTURE) ×2
SUT VIC AB 3-0 PS2 18XBRD (SUTURE) ×1 IMPLANT
SYR CONTROL 10ML LL (SYRINGE) ×1 IMPLANT
TOWEL OR 17X24 6PK STRL BLUE (TOWEL DISPOSABLE) ×4 IMPLANT
TRAY FOLEY CATH 14FR (SET/KITS/TRAYS/PACK) ×2 IMPLANT
WATER STERILE IRR 1000ML POUR (IV SOLUTION) ×2 IMPLANT

## 2011-07-26 NOTE — Transfer of Care (Signed)
Immediate Anesthesia Transfer of Care Note  Patient: Kristina Clements  Procedure(s) Performed:  POST PARTUM TUBAL LIGATION  Patient Location: PACU  Anesthesia Type: Spinal  Level of Consciousness: alert  and oriented  Airway & Oxygen Therapy: Patient Spontanous Breathing  Post-op Assessment: Report given to PACU RN and Post -op Vital signs reviewed and stable  Post vital signs: stable  Complications: No apparent anesthesia complications

## 2011-07-26 NOTE — Progress Notes (Signed)
Patient taken to NICU before transport to [facility]

## 2011-07-26 NOTE — Progress Notes (Signed)
Prep for surgery. Voided, no s/sx of Hypoglycemia. CBG 106 in am, fasting. Ambulated to NICU X2 this am. D/C'D SL d/t leaking when flushed. Unable to obtain new IV before transport. OR notified and will insert when arrives in surgery. MD discussed surgery with Pt. This am. Vaginal bleeding WDL.

## 2011-07-26 NOTE — Brief Op Note (Signed)
07/24/2011 - 07/26/2011  2:34 PM  PATIENT:  Kristina Clements  30 y.o. female  PRE-OPERATIVE DIAGNOSIS:  Desires Sterilization  POST-OPERATIVE DIAGNOSIS:  Desires Sterilization  PROCEDURE:  Procedure(s): POST PARTUM TUBAL LIGATION with Modified Pomeroy Method  SURGEON:  Surgeon(s): Jeani Hawking, MD  PHYSICIAN ASSISTANT:   ASSISTANTS: none   ANESTHESIA:   epidural  EBL:  Total I/O In: 1200 [I.V.:1200] Out: 500 [Urine:500]  BLOOD ADMINISTERED:none  DRAINS: none   LOCAL MEDICATIONS USED:  NONE  SPECIMEN:  Source of Specimen:  Bilateral Fallopian Tube Segments  DISPOSITION OF SPECIMEN:  PATHOLOGY  COUNTS:  YES  TOURNIQUET:  * No tourniquets in log *  DICTATION: .Other Dictation: Dictation Number [id number]  PLAN OF CARE: Admit to inpatient   PATIENT DISPOSITION:  PACU - hemodynamically stable.   Delay start of Pharmacological VTE agent (>24hrs) due to surgical blood loss or risk of bleeding:  {YES/NO/NOT APPLICABLE:20182

## 2011-07-26 NOTE — Progress Notes (Signed)
Post Partum Day 2 Subjective: no complaints, up ad lib, voiding, + flatus and desires BTL prior to discharge. Baby stable in NICU> BS stable and L arm in soft cast  Objective: Blood pressure 117/71, pulse 98, temperature 98.2 F (36.8 C), temperature source Oral, resp. rate 18, height 5\' 10"  (1.778 m), weight 108.863 kg (240 lb), SpO2 84.00%, unknown if currently breastfeeding.  Physical Exam:  General: alert and cooperative Lochia: appropriate Uterine Fundus: firm Perineum intact DVT Evaluation: No evidence of DVT seen on physical exam.   Basename 07/25/11 0545 07/24/11 1541  HGB 10.6* 12.1  HCT 31.3* 36.6    Assessment/Plan: Plan for later pm discharge. Patient is NPO with epidural in place    LOS: 2 days   Taleshia Luff G 07/26/2011, 7:45 AM

## 2011-07-26 NOTE — Anesthesia Preprocedure Evaluation (Signed)
Anesthesia Evaluation  Patient identified by MRN, date of birth, ID band Patient awake    Reviewed: Allergy & Precautions, H&P , NPO status , Patient's Chart, lab work & pertinent test results  Airway Mallampati: I TM Distance: >3 FB Neck ROM: full    Dental No notable dental hx.    Pulmonary neg pulmonary ROS,    Pulmonary exam normal       Cardiovascular neg cardio ROS     Neuro/Psych Negative Neurological ROS  Negative Psych ROS   GI/Hepatic negative GI ROS, Neg liver ROS,   Endo/Other  Diabetes mellitus-, GestationalMorbid obesity  Renal/GU negative Renal ROS  Genitourinary negative   Musculoskeletal negative musculoskeletal ROS (+)   Abdominal (+) obese,   Peds negative pediatric ROS (+)  Hematology negative hematology ROS (+)   Anesthesia Other Findings   Reproductive/Obstetrics (+) Pregnancy                           Anesthesia Physical Anesthesia Plan  ASA: III  Anesthesia Plan: Epidural   Post-op Pain Management:    Induction:   Airway Management Planned:   Additional Equipment:   Intra-op Plan:   Post-operative Plan:   Informed Consent: I have reviewed the patients History and Physical, chart, labs and discussed the procedure including the risks, benefits and alternatives for the proposed anesthesia with the patient or authorized representative who has indicated his/her understanding and acceptance.     Plan Discussed with:   Anesthesia Plan Comments:         Anesthesia Quick Evaluation  

## 2011-07-26 NOTE — Anesthesia Procedure Notes (Signed)
Spinal  Patient location during procedure: OR Start time: 07/26/2011 2:11 PM Staffing Performed by: anesthesiologist  Preanesthetic Checklist Completed: patient identified, site marked, surgical consent, pre-op evaluation, timeout performed, IV checked, risks and benefits discussed and monitors and equipment checked Spinal Block Patient position: sitting Prep: DuraPrep Patient monitoring: heart rate, cardiac monitor, continuous pulse ox and blood pressure Approach: midline Location: L3-4 Injection technique: single-shot Needle Needle type: Sprotte  Needle gauge: 24 G Needle length: 9 cm Assessment Sensory level: T4 Additional Notes Patient identified.  Risk benefits discussed including failed block, incomplete pain control, headache, nerve damage, paralysis, blood pressure changes, nausea, vomiting, reactions to medication both toxic or allergic, and postpartum back pain.  Patient expressed understanding and wished to proceed.  All questions were answered.  Sterile technique used throughout procedure.  CSF was clear.  No parasthesia or other complications.  Please see nursing notes for vital signs.

## 2011-07-26 NOTE — Anesthesia Postprocedure Evaluation (Signed)
Anesthesia Post Note  Patient: Kristina Clements  Procedure(s) Performed:  POST PARTUM TUBAL LIGATION  Anesthesia type: Spinal  Patient location: PACU  Post pain: Pain level controlled  Post assessment: Post-op Vital signs reviewed  Last Vitals:  Filed Vitals:   07/26/11 0514  BP: 117/71  Pulse: 98  Temp: 36.8 C  Resp: 18    Post vital signs: Reviewed  Level of consciousness: awake  Complications: No apparent anesthesia complications

## 2011-07-27 MED ORDER — IBUPROFEN 600 MG PO TABS: 600.0000 mg | ORAL_TABLET | Freq: Four times a day (QID) | ORAL | Status: AC | PRN

## 2011-07-27 MED ORDER — BENZOCAINE-MENTHOL 20-0.5 % EX AERO
INHALATION_SPRAY | CUTANEOUS | Status: AC
  Filled 2011-07-27: qty 56

## 2011-07-27 NOTE — Anesthesia Postprocedure Evaluation (Signed)
  Anesthesia Post-op Note  Patient: Kristina Clements  Procedure(s) Performed:  POST PARTUM TUBAL LIGATION  Patient Location: Mother/Baby  Anesthesia Type: Spinal  Level of Consciousness: alert  and oriented  Airway and Oxygen Therapy: Patient Spontanous Breathing  Post-op Pain: mild  Post-op Assessment: Patient's Cardiovascular Status Stable and Respiratory Function Stable  Post-op Vital Signs: stable  Complications: No apparent anesthesia complications

## 2011-07-27 NOTE — Progress Notes (Signed)
Post Partum Day 3 Subjective: no complaints, up ad lib, voiding and tolerating PO  Objective: Blood pressure 114/73, pulse 87, temperature 98.2 F (36.8 C), temperature source Oral, resp. rate 20, height 5\' 10"  (1.778 m), weight 108.863 kg (240 lb), SpO2 100.00%, unknown if currently breastfeeding.  Physical Exam:  General: alert and cooperative Lochia: appropriate Uterine Fundus: firm Incision: healing well, no significant drainage, no dehiscence, no significant erythema DVT Evaluation: No evidence of DVT seen on physical exam.   Basename 07/26/11 1930 07/25/11 0545  HGB 11.3* 10.6*  HCT 34.3* 31.3*    Assessment/Plan: Discharge home ibuprofen   LOS: 3 days   Evelynn Hench L 07/27/2011, 10:12 AM

## 2011-07-27 NOTE — Addendum Note (Signed)
Addendum  created 07/27/11 1610 by Fanny Dance   Modules edited:Notes Section

## 2011-07-27 NOTE — Discharge Summary (Signed)
Obstetric Discharge Summary Reason for Admission: onset of labor Prenatal Procedures: none Intrapartum Procedures: spontaneous vaginal delivery and tubal ligation Postpartum Procedures: P.P. tubal ligation Complications-Operative and Postpartum: none Hemoglobin  Date Value Range Status  07/26/2011 11.3* 12.0-15.0 (g/dL) Final     HCT  Date Value Range Status  07/26/2011 34.3* 36.0-46.0 (%) Final    Discharge Diagnoses: Term Pregnancy-delivered  Discharge Information: Date: 07/27/2011 Activity: pelvic rest Diet: routine Medications: Ibuprofen Condition: improved Instructions: refer to practice specific booklet Discharge to: home   Newborn Data: Live born female  Birth Weight: 10 lb 13.2 oz (4910 g) APGAR: 1, 8  Home with NICU.  Jaida Basurto L 07/27/2011, 10:13 AM

## 2011-07-27 NOTE — Op Note (Signed)
NAMELILYANA, [name]             ACCOUNT NO.:  [id number]  MEDICAL RECORD NO.:  [id number]  LOCATION:  9111                          FACILITY:  [facility]  PHYSICIAN:  Dequincy Born L. [place] Retana, M.D.DATE OF BIRTH:  Dec 24, 1980  DATE OF PROCEDURE:  07/26/2011 DATE OF DISCHARGE:                              OPERATIVE REPORT   PREOPERATIVE DIAGNOSIS:  Postpartum and desires permanent sterilization.  POSTOPERATIVE DIAGNOSIS:  Postpartum and desires permanent sterilization.  PROCEDURE:  Modified Pomeroy bilateral tubal ligation.  SURGEON:  Fabiano Ginley L. Vincente Poli, MD  ANESTHESIA:  Epidural.  FINDINGS:  Normal fallopian tubes seen bilaterally.  PROCEDURE:  The patient had been consented and had adamantly wanted a permanent sterilization.  The risk of failure have been discussed with the patient.  Risks of the procedure, injury to internal organs, infection, bleeding, and failure resulting in pregnancy or ectopic pregnancy were discussed with the patient.  The patient was taken to the operating room.  Epidural was dosed.  She was then prepped and draped in usual sterile fashion.  In-and-out catheter was used to empty the bladder.  The Allis clamps were placed on either side of the umbilicus, a small infraumbilical incision was made.  The fascia was easily entered using Mayo scissors.  There were no adhesions noted.  I was able to elevate the right fallopian tube and followed to its fimbriated end. After the fimbriated end was directly visualized, we grasped the mid portion of the tube and tied off with 3-cm knuckle with plain gut suture x2 and excised that knuckle.  The ends were then cauterized.  This segment was sent as right lobe fallopian tube segment.  We then did this identical procedure on the left side with careful attention to visualize the fimbriated end and perform a modified Pomeroy tubal ligation on that side as well.  The patient tolerated the procedure well.  The fascia was closed  using 0 Vicryl in a running stitch.  The skin was closed with a 2- 0 Vicryl in a running stitch.  Local was infiltrated.  The patient will be taken to recovery room and then go back to the postpartum floor and she will go home tomorrow.  All sponge, lap, and instrument counts were correct x2.     Rida Loudin L. Vincente Poli, M.D.     Florestine Avers  D:  07/26/2011  T:  07/27/2011  Job:  [id number]

## 2011-07-29 ENCOUNTER — Encounter (HOSPITAL_COMMUNITY): Payer: Self-pay | Admitting: Obstetrics and Gynecology

## 2011-10-01 ENCOUNTER — Other Ambulatory Visit: Payer: Self-pay | Admitting: Obstetrics and Gynecology

## 2011-11-04 ENCOUNTER — Other Ambulatory Visit: Payer: Self-pay | Admitting: Obstetrics and Gynecology

## 2012-06-07 IMAGING — US US OB COMP LESS 14 WK
1 series · 14 of 28 positions shown · non-contrast
Comparison: None.

CLINICAL DATA: Pregnant, bleeding

OBSTETRIC <14 WK ULTRASOUND
TECHNIQUE: Transabdominal ultrasound was performed for evaluation
of the gestation as well as the maternal uterus and adnexal
regions.

[Series 1: us ob comp less 14 wks · 0.19mm/px · 14 of 56 slices shown]
[im 3/56]
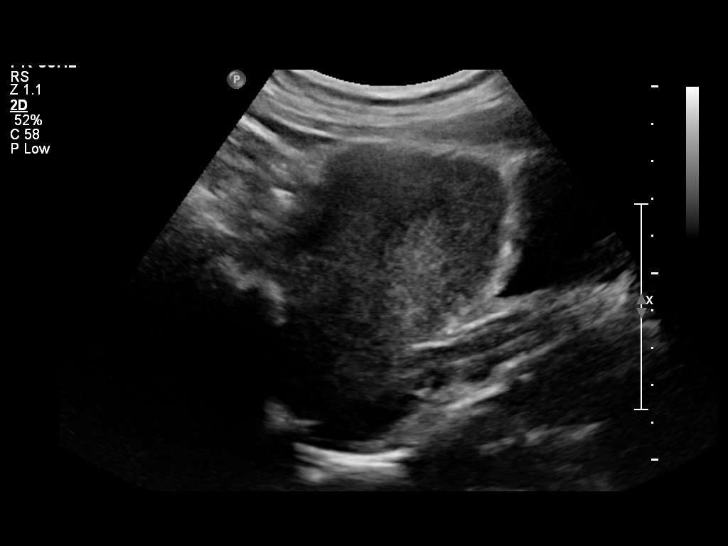
[im 7/56]
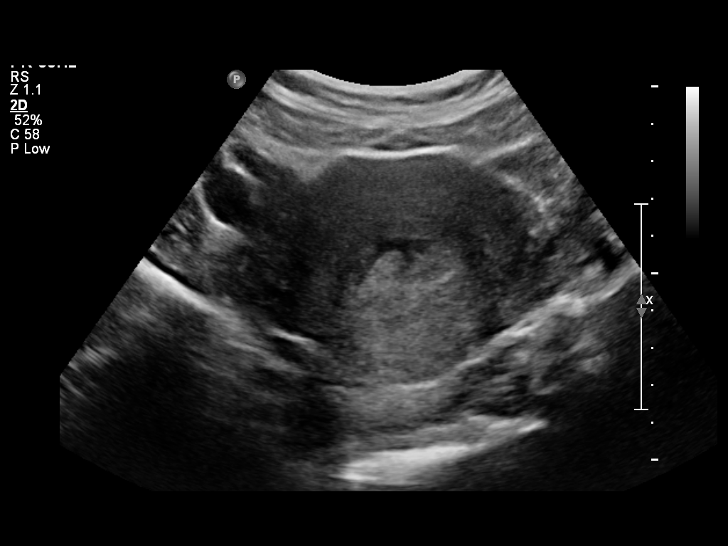
[im 11/56]
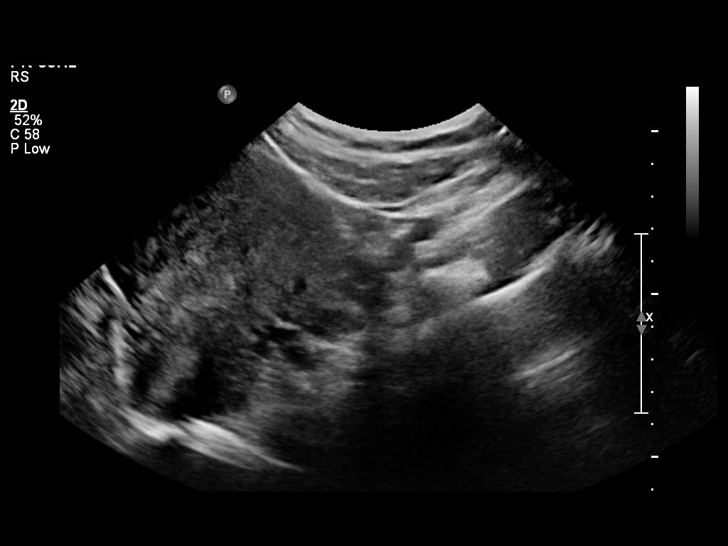
[im 15/56]
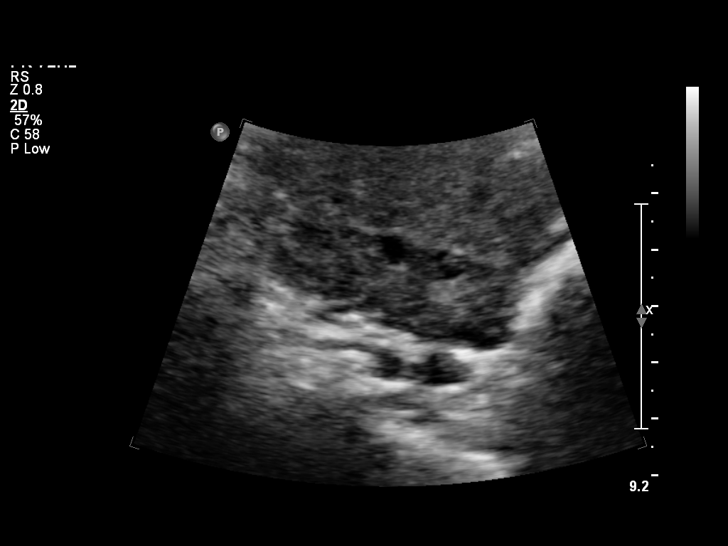
[im 19/56]
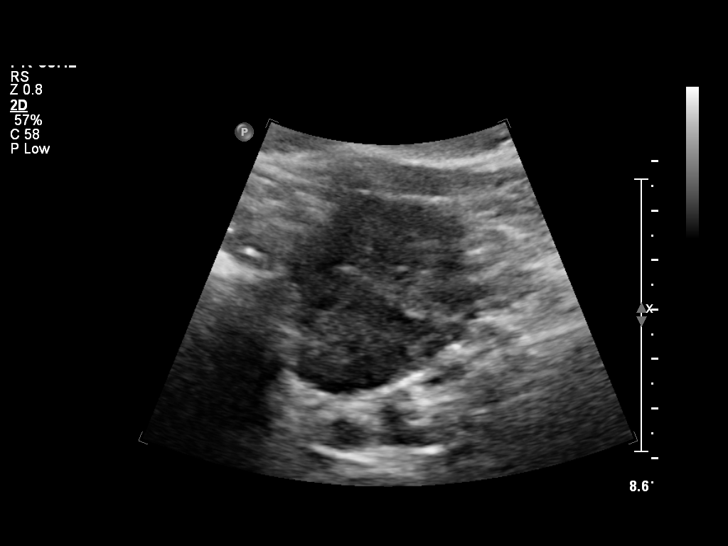
[im 23/56]
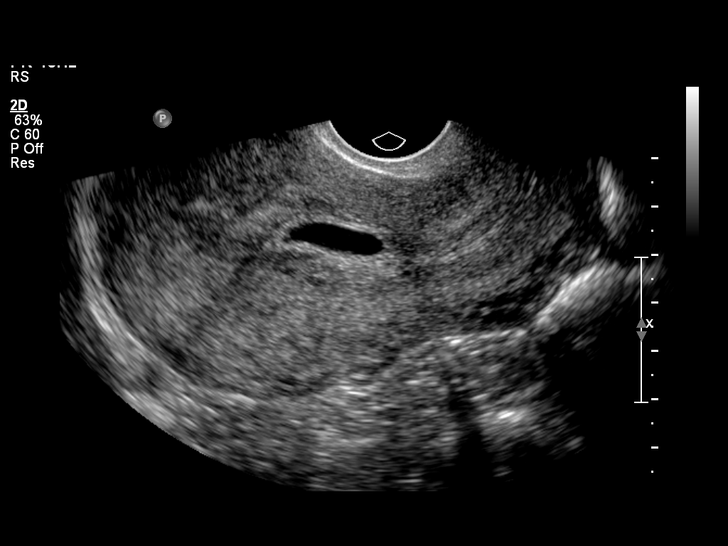
[im 27/56]
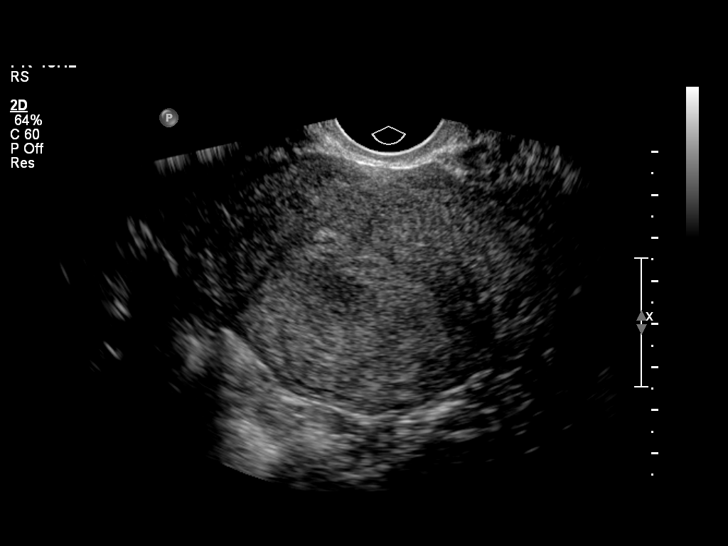
[im 31/56]
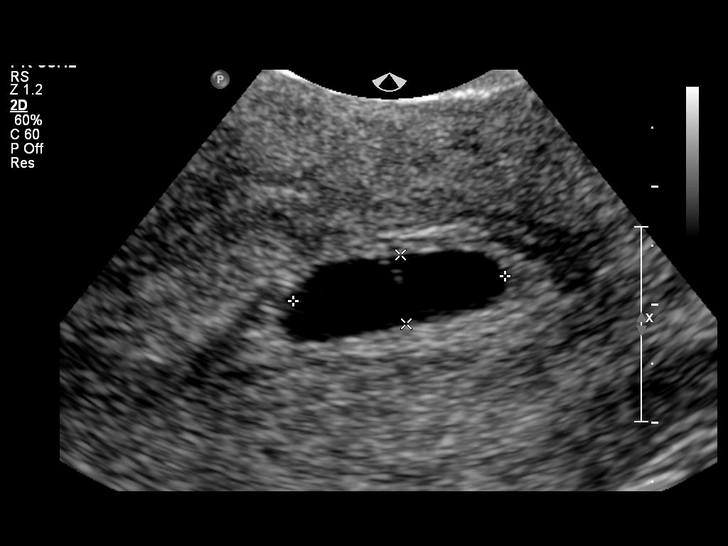
[im 35/56]
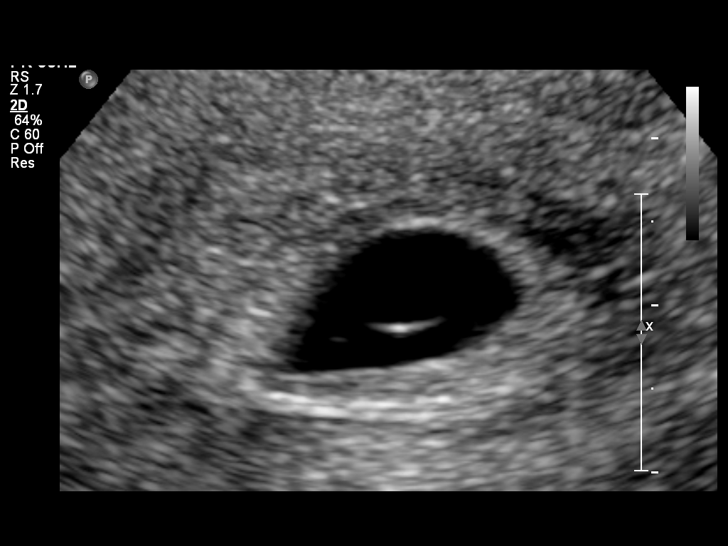
[im 39/56]
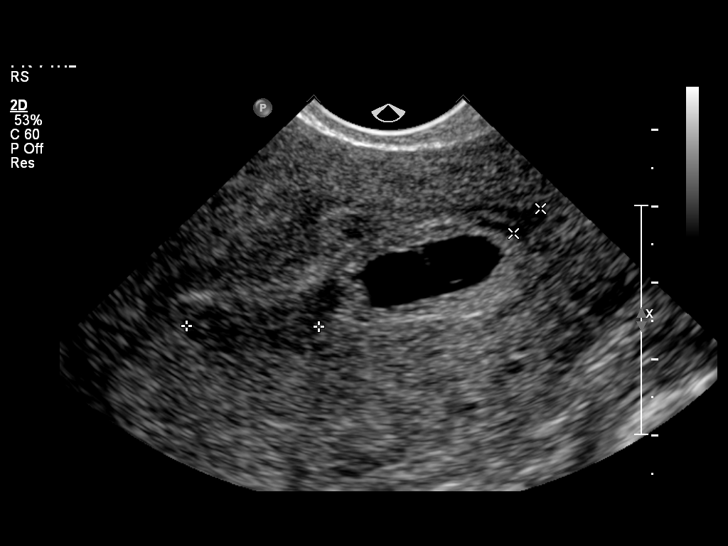
[im 43/56]
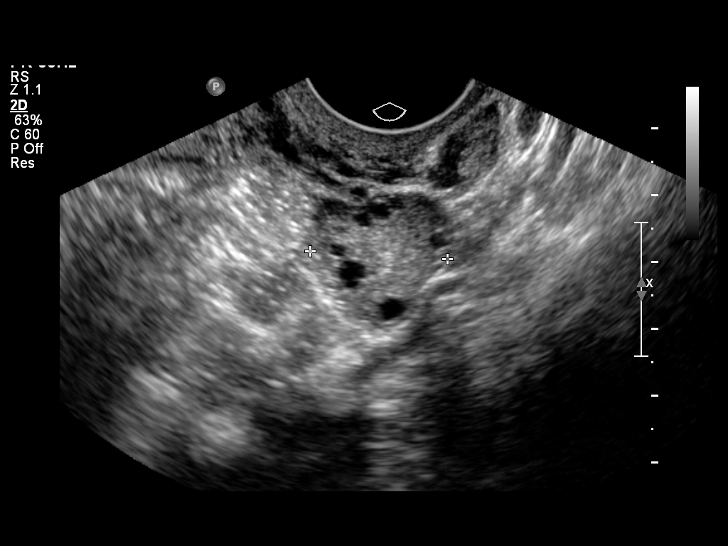
[im 47/56]
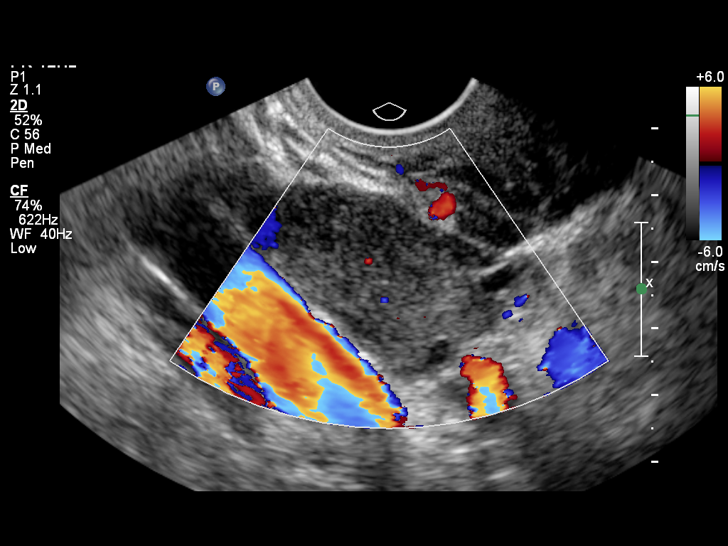
[im 51/56]
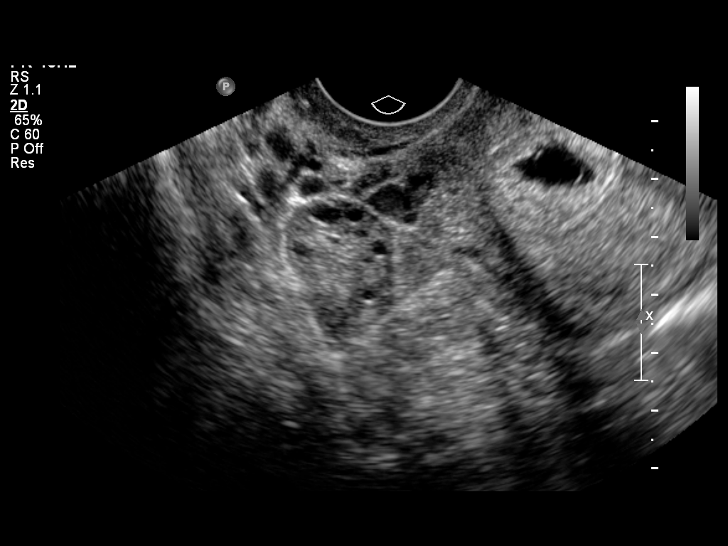
[im 56/56]
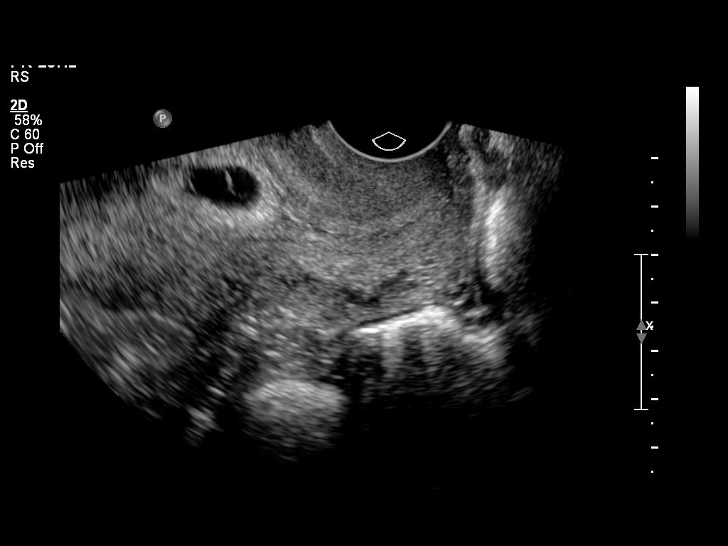

[14 of 28 positions shown; findings below may reference images not displayed]

Intrauterine gestational sac: Single, irregular in morphology
Yolk sac: Not visualized
Embryo: not  visible
Cardiac Activity: not visible

MSD: 1.2 cm mm  6w  3d

Maternal uterus/Adnexae:
There may be a small subchorionic hemorrhage.  Ovaries are
unremarkable.  No free fluid.
IMPRESSION: Irregular intrauterine gestational sac without evident yolk sac or
embryo, suggesting possible blighted ovum versus spontaneous
abortion.  Follow-up Beta HCG and/or ultrasound may be useful for
further assessment.

## 2014-06-27 ENCOUNTER — Encounter (HOSPITAL_COMMUNITY): Payer: Self-pay | Admitting: Obstetrics and Gynecology

## 2014-10-08 ENCOUNTER — Ambulatory Visit (INDEPENDENT_AMBULATORY_CARE_PROVIDER_SITE_OTHER): Payer: Commercial Managed Care - PPO | Admitting: Family Medicine

## 2014-10-08 VITALS — BP 116/72 | HR 80 | Temp 97.8°F | Ht 70.0 in | Wt 205.6 lb

## 2014-10-08 DIAGNOSIS — R300 Dysuria: Secondary | ICD-10-CM

## 2014-10-08 DIAGNOSIS — N3000 Acute cystitis without hematuria: Secondary | ICD-10-CM

## 2014-10-08 LAB — POCT UA - MICROSCOPIC ONLY
CASTS, UR, LPF, POC: NEGATIVE
CRYSTALS, UR, HPF, POC: NEGATIVE

## 2014-10-08 LAB — POCT URINALYSIS DIPSTICK
BILIRUBIN UA: NEGATIVE
GLUCOSE UA: 100
KETONES UA: NEGATIVE
NITRITE UA: POSITIVE
PH UA: 6.5
PROTEIN UA: 30
Spec Grav, UA: 1.025
UROBILINOGEN UA: 1

## 2014-10-08 LAB — GLUCOSE, POCT (MANUAL RESULT ENTRY): POC GLUCOSE: 78 mg/dL (ref 70–99)

## 2014-10-08 MED ORDER — CIPROFLOXACIN HCL 500 MG PO TABS
500.0000 mg | ORAL_TABLET | Freq: Two times a day (BID) | ORAL | Status: DC
Start: 2014-10-08 — End: 2018-05-18

## 2014-10-08 NOTE — Progress Notes (Signed)
[facility] [address] [place] [address] [phone]  Date:  10/08/2014   Name:  Kristina Clements   DOB:  July 13, 1981   MRN:  [id number]  PCP:  No PCP Per Patient    Chief Complaint: Dysuria   History of Present Illness:  Kristina Clements is a 34 y.o. very pleasant female patient who presents with the following:  Here today with concern regarding UTI LMP 1/25, she is s/p BTL She has noted typical sx for about 2-3 days.  She tried some azo OTC which helps with her sx which include urinary frequency, dysuria, urine odor, lower back ache.  She has noted chills but any fever.   She notes a slight abdominal discomfort, no vomiting.   She did self- treat for a yeast vaginitis a few days ago  She is generally in good health  Married with 2 children, 35 and 30 years old  There are no active problems to display for this patient.   Past Medical History  Diagnosis Date  . Gestational diabetes     Past Surgical History  Procedure Laterality Date  . Foot surgery    . Tubal ligation  07/26/2011    Procedure: POST PARTUM TUBAL LIGATION;  Surgeon: Cyril Mourning, MD;  Location: [facility];  Service: Gynecology;  Laterality: Bilateral;    History  Substance Use Topics  . Smoking status: Never Smoker   . Smokeless tobacco: Not on file  . Alcohol Use: No    Family History  Problem Relation Age of Onset  . Diabetes Mother   . Hypertension Mother   . Diabetes Father     No Known Allergies  Medication list has been reviewed and updated.  Current Outpatient Prescriptions on File Prior to Visit  Medication Sig Dispense Refill  . prenatal vitamin w/FE, FA (PRENATAL 1 + 1) 27-1 MG TABS Take 1 tablet by mouth daily.       No current facility-administered medications on file prior to visit.    Review of Systems:  As per HPI- otherwise negative.   Physical Examination: Filed Vitals:   10/08/14 1127  BP: 116/72  Pulse: 80  Temp: 97.8 F (36.6 C)    Filed Vitals:   10/08/14 1127  Height: 5\' 10"  (1.778 m)  Weight: 205 lb 9.6 oz (93.26 kg)   Body mass index is 29.5 kg/(m^2). Ideal Body Weight: Weight in (lb) to have BMI = 25: 173.9  GEN: WDWN, NAD, Non-toxic, A & O x 3 HEENT: Atraumatic, Normocephalic. Neck supple. No masses, No LAD. Ears and Nose: No external deformity. CV: RRR, No M/G/R. No JVD. No thrill. No extra heart sounds. PULM: CTA B, no wheezes, crackles, rhonchi. No retractions. No resp. distress. No accessory muscle use. ABD: S, minimal suprapubic tenderness, ND. No rebound. No HSM.  Mild bilateral CVA tenderness  EXTR: No c/c/e NEURO Normal gait.  PSYCH: Normally interactive. Conversant. Not depressed or anxious appearing.  Calm demeanor.   Results for orders placed or performed in visit on 10/08/14  POCT urinalysis dipstick  Result Value Ref Range   Color, UA orange    Clarity, UA cloudy    Glucose, UA 100    Bilirubin, UA neg    Ketones, UA neg    Spec Grav, UA 1.025    Blood, UA mod    pH, UA 6.5    Protein, UA 30    Urobilinogen, UA 1.0    Nitrite, UA pos  Leukocytes, UA small (1+)   POCT UA - Microscopic Only  Result Value Ref Range   WBC, Ur, HPF, POC tntc    RBC, urine, microscopic tntc    Bacteria, U Microscopic 1+    Mucus, UA trace    Epithelial cells, urine per micros 0-1    Crystals, Ur, HPF, POC neg    Casts, Ur, LPF, POC neg    Yeast, UA buds   POCT glucose (manual entry)  Result Value Ref Range   POC Glucose 78 70 - 99 mg/dl   Glucose on UA due to azo  Assessment and Plan: Acute cystitis without hematuria - Plan: Urine culture, ciprofloxacin (CIPRO) 500 MG tablet  Dysuria - Plan: POCT urinalysis dipstick, POCT UA - Microscopic Only, POCT glucose (manual entry)  Treat for UTI/ possible early pyelo with cipro.  Await culture See patient instructions for more details.   She is asked to follow-up promptly if not getting better  Signed Lamar Blinks, MD

## 2014-10-08 NOTE — Patient Instructions (Signed)
Take the cipro twice a day for 7 days, drink plenty of water, and continue azo as needed I will be in touch with your urine culture Let me know if you do not feel better in the next couple of days- Sooner if worse or if you have a fever

## 2014-10-11 LAB — URINE CULTURE

## 2014-10-14 ENCOUNTER — Encounter: Payer: Self-pay | Admitting: Family Medicine

## 2014-11-07 ENCOUNTER — Ambulatory Visit (INDEPENDENT_AMBULATORY_CARE_PROVIDER_SITE_OTHER): Payer: Commercial Managed Care - PPO | Admitting: Family Medicine

## 2014-11-07 VITALS — BP 134/80 | HR 74 | Temp 97.9°F | Resp 19 | Ht 69.0 in | Wt 204.0 lb

## 2014-11-07 DIAGNOSIS — J988 Other specified respiratory disorders: Secondary | ICD-10-CM

## 2014-11-07 DIAGNOSIS — J22 Unspecified acute lower respiratory infection: Secondary | ICD-10-CM

## 2014-11-07 DIAGNOSIS — J302 Other seasonal allergic rhinitis: Secondary | ICD-10-CM

## 2014-11-07 DIAGNOSIS — R05 Cough: Secondary | ICD-10-CM | POA: Diagnosis not present

## 2014-11-07 DIAGNOSIS — R059 Cough, unspecified: Secondary | ICD-10-CM

## 2014-11-07 MED ORDER — FLUCONAZOLE 150 MG PO TABS: 150.0000 mg | ORAL_TABLET | Freq: Every day | ORAL | Status: DC

## 2014-11-07 MED ORDER — AMOXICILLIN-POT CLAVULANATE 875-125 MG PO TABS: 1.0000 | ORAL_TABLET | Freq: Two times a day (BID) | ORAL | Status: DC

## 2014-11-07 MED ORDER — HYDROCODONE-HOMATROPINE 5-1.5 MG/5ML PO SYRP
5.0000 mL | ORAL_SOLUTION | Freq: Every evening | ORAL | Status: DC | PRN
Start: 2014-11-07 — End: 2018-05-18

## 2014-11-07 MED ORDER — BENZONATATE 100 MG PO CAPS: 200.0000 mg | ORAL_CAPSULE | Freq: Two times a day (BID) | ORAL | Status: DC | PRN

## 2014-11-07 NOTE — Progress Notes (Signed)
Chief Complaint:  Chief Complaint  Patient presents with  . Cough    Productive. x 1 week  . Nasal Congestion    x 1 week  . Ear Pain    x 1 week    HPI: Kristina Clements is a 34 y.o. female who is here for  one-week history of productive cough, yellow, depression and ear tingling. It started after she cleaned herself and clot" that a pair of each short have mold on them from last year. She states that she used her allergy medication to help with allergy symptoms daily. However she has had worsening symptoms last 1 week. She she thinks it started out in her throat and then is now back in her chest. She denies any chest pain, wheezing, shortness of breath. She she denies any headaches, has had nausea with excessive cough and mucus drainage. She has lost her voice because of the coughing. Last week she had some chills. Denies fevers. 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  Past Medical History  Diagnosis Date  . Gestational diabetes    Past Surgical History  Procedure Laterality Date  . Foot surgery    . Tubal ligation  07/26/2011    Procedure: POST PARTUM TUBAL LIGATION;  Surgeon: Cyril Mourning, MD;  Location: [facility];  Service: Gynecology;  Laterality: Bilateral;   History   Social History  . Marital Status: Married    Spouse Name: N/A  . Number of Children: N/A  . Years of Education: N/A   Social History Main Topics  . Smoking status: Never Smoker   . Smokeless tobacco: Not on file  . Alcohol Use: No  . Drug Use: No  . Sexual Activity: Not Currently   Other Topics Concern  . None   Social History Narrative   Family History  Problem Relation Age of Onset  . Diabetes Mother   . Hypertension Mother   . Diabetes Father    No Known Allergies Prior to Admission medications   Medication Sig Start Date End Date Taking? Authorizing Provider  amoxicillin-clavulanate (AUGMENTIN) 875-125 MG per tablet Take 1 tablet by mouth 2 (two) times daily. 11/07/14    Yolunda Kloos P Khalel Alms, DO  benzonatate (TESSALON) 100 MG capsule Take 2 capsules (200 mg total) by mouth 2 (two) times daily as needed. 11/07/14   Ife Vitelli P Rosilyn Coachman, DO  ciprofloxacin (CIPRO) 500 MG tablet Take 1 tablet (500 mg total) by mouth 2 (two) times daily. Patient not taking: Reported on 11/07/2014 10/08/14   Darreld Mclean, MD  fluconazole (DIFLUCAN) 150 MG tablet Take 1 tablet (150 mg total) by mouth daily. 11/07/14   Ean Gettel P Antrice Pal, DO  HYDROcodone-homatropine (HYCODAN) 5-1.5 MG/5ML syrup Take 5 mLs by mouth at bedtime as needed. 11/07/14   Reta Norgren P Quetzali Heinle, DO  Phenazopyridine HCl (AZO TABS PO) Take by mouth.    Historical Provider, MD  prenatal vitamin w/FE, FA (PRENATAL 1 + 1) 27-1 MG TABS Take 1 tablet by mouth daily.      Historical Provider, MD     ROS: The patient denies fevers, chills, night sweats, unintentional weight loss, chest pain, palpitations, wheezing, dyspnea on exertion, nausea, vomiting, abdominal pain, dysuria, hematuria, melena, numbness, weakness, or tingling.  All other systems have been reviewed and were otherwise negative with the exception of those mentioned in the HPI and as above.    PHYSICAL EXAM: Filed Vitals:   11/07/14 0845  BP: 134/80  Pulse: 74  Temp: 97.9 F (36.6 C)  Resp: 19   Filed Vitals:   11/07/14 0845  Height: 5\' 9"  (1.753 m)  Weight: 204 lb (92.534 kg)   Body mass index is 30.11 kg/(m^2).  General: Alert, no acute distress HEENT:  Normocephalic, atraumatic, oropharynx patent. EOMI, PERRLA Erythematous throat, no exudates, TM normal, +/- sinus tenderness, + erythematous/boggy nasal mucosa Cardiovascular:  Regular rate and rhythm, no rubs murmurs or gallops.  No Carotid bruits, radial pulse intact. No pedal edema.  Respiratory: Clear to auscultation bilaterally.  No wheezes, rales, or rhonchi.  No cyanosis, no use of accessory musculature GI: No organomegaly, abdomen is soft and non-tender, positive bowel sounds.  No masses. Skin: No rashes. Neurologic: Facial  musculature symmetric. Psychiatric: Patient is appropriate throughout our interaction. Lymphatic: No cervical lymphadenopathy Musculoskeletal: Gait intact.   LABS: Results for orders placed or performed in visit on 10/08/14  Urine culture  Result Value Ref Range   Culture ESCHERICHIA COLI    Colony Count >=100,000 COLONIES/ML    Organism ID, Bacteria ESCHERICHIA COLI       Susceptibility   Escherichia coli -  (no method available)    AMPICILLIN 4 Sensitive     AMOX/CLAVULANIC 4 Sensitive     AMPICILLIN/SULBACTAM <=2 Sensitive     PIP/TAZO <=4 Sensitive     IMIPENEM <=0.25 Sensitive     CEFAZOLIN <=4 Sensitive     CEFTRIAXONE <=1 Sensitive     CEFTAZIDIME <=1 Sensitive     CEFEPIME <=1 Sensitive     GENTAMICIN <=1 Sensitive     TOBRAMYCIN <=1 Sensitive     CIPROFLOXACIN <=0.25 Sensitive     LEVOFLOXACIN <=0.12 Sensitive     NITROFURANTOIN <=16 Sensitive     TRIMETH/SULFA <=20 Sensitive   POCT urinalysis dipstick  Result Value Ref Range   Color, UA orange    Clarity, UA cloudy    Glucose, UA 100    Bilirubin, UA neg    Ketones, UA neg    Spec Grav, UA 1.025    Blood, UA mod    pH, UA 6.5    Protein, UA 30    Urobilinogen, UA 1.0    Nitrite, UA pos    Leukocytes, UA small (1+)   POCT UA - Microscopic Only  Result Value Ref Range   WBC, Ur, HPF, POC tntc    RBC, urine, microscopic tntc    Bacteria, U Microscopic 1+    Mucus, UA trace    Epithelial cells, urine per micros 0-1    Crystals, Ur, HPF, POC neg    Casts, Ur, LPF, POC neg    Yeast, UA buds   POCT glucose (manual entry)  Result Value Ref Range   POC Glucose 78 70 - 99 mg/dl     EKG/XRAY:   Primary read interpreted by Dr. Marin Comment at [facility].   ASSESSMENT/PLAN: Encounter Diagnoses  Name Primary?  . Lower respiratory infection (e.g., bronchitis, pneumonia, pneumonitis, pulmonitis) Yes  . Cough   . Seasonal allergies    Treat Symptomatically with Tessalon Perles and Hycodan. Continue with allergy  medications If no improvement then may use Augmentin I don't think that she needs x-rays today, but if her symptoms worsen then she needs to get a chest x-ray and return to office. Follow-up when necessary   Gross sideeffects, risk and benefits, and alternatives of medications d/w patient. Patient is aware that all medications have potential sideeffects and we are unable to predict every sideeffect or drug-drug interaction that  may occur.  Tyler Robidoux, [place], DO 11/07/2014 10:06 AM

## 2014-11-07 NOTE — Patient Instructions (Signed)

## 2015-03-23 ENCOUNTER — Other Ambulatory Visit: Payer: Self-pay | Admitting: Obstetrics and Gynecology

## 2015-03-27 LAB — CYTOLOGY - PAP

## 2018-05-18 ENCOUNTER — Encounter (HOSPITAL_COMMUNITY): Payer: Self-pay

## 2018-05-18 ENCOUNTER — Ambulatory Visit (HOSPITAL_COMMUNITY)
Admission: EM | Admit: 2018-05-18 | Discharge: 2018-05-18 | Disposition: A | Discharge status: Home / Self Care | Payer: 59 | Attending: Family Medicine | Admitting: Family Medicine

## 2018-05-18 DIAGNOSIS — M545 Low back pain, unspecified: Secondary | ICD-10-CM

## 2018-05-18 DIAGNOSIS — N390 Urinary tract infection, site not specified: Secondary | ICD-10-CM | POA: Insufficient documentation

## 2018-05-18 DIAGNOSIS — M549 Dorsalgia, unspecified: Secondary | ICD-10-CM | POA: Diagnosis present

## 2018-05-18 LAB — POCT URINALYSIS DIP (DEVICE)
BILIRUBIN URINE: NEGATIVE
GLUCOSE, UA: NEGATIVE mg/dL
Hgb urine dipstick: NEGATIVE
Ketones, ur: NEGATIVE mg/dL
Nitrite: NEGATIVE
Protein, ur: NEGATIVE mg/dL
SPECIFIC GRAVITY, URINE: 1.02 (ref 1.005–1.030)
Urobilinogen, UA: 1 mg/dL (ref 0.0–1.0)
pH: 7 (ref 5.0–8.0)

## 2018-05-18 MED ORDER — CEPHALEXIN 500 MG PO CAPS: 500.0000 mg | ORAL_CAPSULE | Freq: Two times a day (BID) | ORAL | 0 refills | Status: AC

## 2018-05-18 MED ORDER — NAPROXEN 500 MG PO TABS: 500.0000 mg | ORAL_TABLET | Freq: Two times a day (BID) | ORAL | 0 refills | Status: DC

## 2018-05-18 NOTE — ED Provider Notes (Signed)
[place]    CSN: [id number] Arrival date & time: 05/18/18  1246     History   Chief Complaint Chief Complaint  Patient presents with  . Back Pain  . Urinary Tract Infection    HPI Kristina Clements is a 37 y.o. female.   Kristina Clements presents with complaints of low back pain and soreness as well as urinary frequency which started approximately 5-7 days ago. No pain with urination but notices her back feels more sore after urination. No blood to urine. Felt chills last night. No known fevers. Denies any vaginal symptoms. LMP 9/8. Denies gi complaints although has had more frequent bm's than normal for her. States has had a uterine infection in the past which did not feel similar to this. Has had bv in the past, but does not have any symptoms of this at this time. No vaginal bleeding or vaginal complaints. Has had UTI's in the past as well as kidney infections. Has not taken any medications for symptoms.    ROS per HPI.      Past Medical History:  Diagnosis Date  . Gestational diabetes     There are no active problems to display for this patient.   Past Surgical History:  Procedure Laterality Date  . FOOT SURGERY    . TUBAL LIGATION  07/26/2011   Procedure: POST PARTUM TUBAL LIGATION;  Surgeon: Cyril Mourning, MD;  Location: [facility];  Service: Gynecology;  Laterality: Bilateral;    OB History    Gravida  4   Para  2   Term  1   Preterm  1   AB  2   Living  2     SAB  2   TAB  0   Ectopic  0   Multiple  0   Live Births  1            Home Medications    Prior to Admission medications   Medication Sig Start Date End Date Taking? Authorizing Provider  cephALEXin (KEFLEX) 500 MG capsule Take 1 capsule (500 mg total) by mouth 2 (two) times daily for 7 days. 05/18/18 05/25/18  Zigmund Gottron, NP  naproxen (NAPROSYN) 500 MG tablet Take 1 tablet (500 mg total) by mouth 2 (two) times daily. 05/18/18   Zigmund Gottron, NP  Phenazopyridine  HCl (AZO TABS PO) Take by mouth.    [provider]  prenatal vitamin w/FE, FA (PRENATAL 1 + 1) 27-1 MG TABS Take 1 tablet by mouth daily.      [provider]    Family History Family History  Problem Relation Age of Onset  . Diabetes Mother   . Hypertension Mother   . Diabetes Father     Social History Social History   Tobacco Use  . Smoking status: Never Smoker  Substance Use Topics  . Alcohol use: No  . Drug use: No     Allergies   Patient has no known allergies.   Review of Systems Review of Systems   Physical Exam Triage Vital Signs ED Triage Vitals [05/18/18 1329]  Enc Vitals Group     BP (!) 142/81     Pulse Rate 65     Resp 20     Temp 98.5 F (36.9 C)     Temp Source Temporal     SpO2 100 %     Weight      Height      Head Circumference  Peak Flow      Pain Score      Pain Loc      Pain Edu?      Excl. in [place]?    No data found.  Updated Vital Signs BP (!) 142/81 (BP Location: Right Arm)   Pulse 65   Temp 98.5 F (36.9 C) (Temporal)   Resp 20   LMP 05/03/2018   SpO2 100%    Physical Exam  Constitutional: She is oriented to person, place, and time. She appears well-developed and well-nourished. No distress.  Cardiovascular: Normal rate, regular rhythm and normal heart sounds.  Pulmonary/Chest: Effort normal and breath sounds normal.  Abdominal: Soft. There is no tenderness. There is no rigidity, no rebound, no guarding and no CVA tenderness.  Genitourinary:  Genitourinary Comments: Denies sores, lesions, vaginal bleeding; no pelvic pain; gu exam deferred at this time and patient declined  Musculoskeletal:  Mild low back pain on palpation with full sensation and ROM  Neurological: She is alert and oriented to person, place, and time.  Skin: Skin is warm and dry.     UC Treatments / Results  Labs (all labs ordered are listed, but only abnormal results are displayed) Labs Reviewed  POCT URINALYSIS DIP (DEVICE)  - Abnormal; Notable for the following components:      Result Value   Leukocytes, UA SMALL (*)    All other components within normal limits  URINE CULTURE    EKG None  Radiology No results found.  Procedures Procedures (including critical care time)  Medications Ordered in UC Medications - No data to display  Initial Impression / Assessment and Plan / UC Course  I have reviewed the triage vital signs and the nursing notes.  Pertinent labs & imaging results that were available during my care of the patient were reviewed by me and considered in my medical decision making (see chart for details).     Urinary frequency with low back pain, no vaginal symptoms. Small leuks to urine. Culture obtained and pending. Afebrile. No cva tenderness. Will start keflex assuming possible UTI at this time pending urine culture. Encouraged close follow up with gyne/PCP for recheck. Return precautions provided. Patient verbalized understanding and agreeable to plan.    Final Clinical Impressions(s) / UC Diagnoses   Final diagnoses:  Lower urinary tract infectious disease  Acute bilateral low back pain without sciatica     Discharge Instructions     Drink plenty of water to empty bladder regularly. Avoid alcohol and caffeine as these may irritate the bladder.  Complete course of antibiotics.  Naproxen twice a day, take with food.  I have sent your urine to be cultured. Will notify you of any positive findings and if any changes to treatment are needed.   If symptoms worsen or do not improve in the next week to return to be seen or to follow up with your gynecologist and/or primary care provider.    ED Prescriptions    Medication Sig Dispense Auth. Provider   cephALEXin (KEFLEX) 500 MG capsule Take 1 capsule (500 mg total) by mouth 2 (two) times daily for 7 days. 14 capsule Augusto Gamble B, NP   naproxen (NAPROSYN) 500 MG tablet Take 1 tablet (500 mg total) by mouth 2 (two) times daily. 30  tablet Zigmund Gottron, NP     Controlled Substance Prescriptions [place] Controlled Substance Registry consulted? Not Applicable   Zigmund Gottron, NP 05/18/18 1415

## 2018-05-18 NOTE — ED Triage Notes (Signed)
Pt presents with urinary tract symptoms; frequent urination, discomfort when urinating, discoloration and back pain.

## 2018-05-18 NOTE — Discharge Instructions (Signed)
Drink plenty of water to empty bladder regularly. Avoid alcohol and caffeine as these may irritate the bladder.  Complete course of antibiotics.  Naproxen twice a day, take with food.  I have sent your urine to be cultured. Will notify you of any positive findings and if any changes to treatment are needed.   If symptoms worsen or do not improve in the next week to return to be seen or to follow up with your gynecologist and/or primary care provider.

## 2018-05-19 LAB — URINE CULTURE

## 2021-05-14 DIAGNOSIS — R87619 Unspecified abnormal cytological findings in specimens from cervix uteri: Secondary | ICD-10-CM | POA: Insufficient documentation

## 2021-05-16 DIAGNOSIS — D259 Leiomyoma of uterus, unspecified: Secondary | ICD-10-CM | POA: Insufficient documentation

## 2021-05-16 LAB — HM PAP SMEAR

## 2021-08-08 ENCOUNTER — Encounter: Payer: Self-pay | Admitting: Nurse Practitioner

## 2021-08-08 ENCOUNTER — Other Ambulatory Visit: Payer: Self-pay

## 2021-08-08 ENCOUNTER — Ambulatory Visit: Payer: 59 | Admitting: Nurse Practitioner

## 2021-08-08 VITALS — BP 138/82 | HR 88 | Temp 96.5°F | Ht 69.5 in | Wt 224.2 lb

## 2021-08-08 DIAGNOSIS — Z136 Encounter for screening for cardiovascular disorders: Secondary | ICD-10-CM

## 2021-08-08 DIAGNOSIS — Z8249 Family history of ischemic heart disease and other diseases of the circulatory system: Secondary | ICD-10-CM | POA: Insufficient documentation

## 2021-08-08 DIAGNOSIS — Z8632 Personal history of gestational diabetes: Secondary | ICD-10-CM

## 2021-08-08 DIAGNOSIS — Z13220 Encounter for screening for lipoid disorders: Secondary | ICD-10-CM

## 2021-08-08 DIAGNOSIS — Z800 Family history of malignant neoplasm of digestive organs: Secondary | ICD-10-CM | POA: Insufficient documentation

## 2021-08-08 DIAGNOSIS — R030 Elevated blood-pressure reading, without diagnosis of hypertension: Secondary | ICD-10-CM | POA: Diagnosis not present

## 2021-08-08 DIAGNOSIS — M5430 Sciatica, unspecified side: Secondary | ICD-10-CM | POA: Insufficient documentation

## 2021-08-08 LAB — BASIC METABOLIC PANEL
BUN: 9 mg/dL (ref 6–23)
CO2: 27 mEq/L (ref 19–32)
Calcium: 9.2 mg/dL (ref 8.4–10.5)
Chloride: 103 mEq/L (ref 96–112)
Creatinine, Ser: 0.83 mg/dL (ref 0.40–1.20)
GFR: 88.7 mL/min (ref >60.00)
Glucose, Bld: 87 mg/dL (ref 70–99)
Potassium: 3.8 mEq/L (ref 3.5–5.1)
Sodium: 136 mEq/L (ref 135–145)

## 2021-08-08 LAB — LIPID PANEL
Cholesterol: 158 mg/dL (ref 0–200)
HDL: 54 mg/dL (ref >39.00)
LDL Cholesterol: 96 mg/dL (ref 0–99)
NonHDL: 103.53
Total CHOL/HDL Ratio: 3
Triglycerides: 37 mg/dL (ref 0.0–149.0)
VLDL: 7.4 mg/dL (ref 0.0–40.0)

## 2021-08-08 LAB — TSH: TSH: 1.42 u[IU]/mL (ref 0.35–5.50)

## 2021-08-08 LAB — CBC
HCT: 38.3 % (ref 36.0–46.0)
Hemoglobin: 12.4 g/dL (ref 12.0–15.0)
MCHC: 32.5 g/dL (ref 30.0–36.0)
MCV: 85.5 fl (ref 78.0–100.0)
Platelets: 249 10*3/uL (ref 150.0–400.0)
RBC: 4.48 Mil/uL (ref 3.87–5.11)
RDW: 14.1 % (ref 11.5–15.5)
WBC: 6.4 10*3/uL (ref 4.0–10.5)

## 2021-08-08 LAB — HEMOGLOBIN A1C: Hgb A1c MFr Bld: 6.1 % (ref 4.6–6.5)

## 2021-08-08 NOTE — Progress Notes (Signed)
Subjective:  Patient ID: Kristina Clements, female    DOB: May 15, 1981  Age: 40 y.o. MRN: [id number]  CC: Establish Care (New patient/Pt would like to discuss elevated BP and have lab work done. /Pt is fasting. /Declines all vaccines today. )  HPI  Elevated BP without diagnosis of hypertension First noticed 04/2021: 140/100 and 148/92 by GYN. Asymptomatic. FHx of DM, HTN, CAD and CKD (mother and father) Exercise: burn boot camp, 35mins 3x/week in last 3 months. Diet: admits to high sodium diet Hydration: unknown amount. Hx of gestational DM: 40yrs ago. S/p tubal ligation for contraception No tobacco use ETOH use sparingly No illicit drug use. No caffeine use Admits to increase stress at home. BP Readings from Last 3 Encounters:  08/08/21 138/82  05/18/18 (!) 142/81  11/07/14 134/80   Repeat BP to improved Check cbc, bmp, tsh We discussed the importance of DASh diet, daily exercise and adequate oral hydration Provided printed information. Advised to monitor BP at home 2-3x/week. Call office if BP persistently >140/80 F/up in 25months  History of gestational diabetes Diagnosed 69yrs ago Check hgbA1c today  Reviewed past Medical, Social and Family history today.  Outpatient Medications Prior to Visit  Medication Sig Dispense Refill   naproxen (NAPROSYN) 500 MG tablet Take 1 tablet (500 mg total) by mouth 2 (two) times daily. (Patient not taking: Reported on 08/08/2021) 30 tablet 0   Phenazopyridine HCl (AZO TABS PO) Take by mouth. (Patient not taking: Reported on 08/08/2021)     prenatal vitamin w/FE, FA (PRENATAL 1 + 1) 27-1 MG TABS Take 1 tablet by mouth daily.   (Patient not taking: Reported on 08/08/2021)     No facility-administered medications prior to visit.    ROS See HPI  Objective:  BP 138/82 (BP Location: Left Arm, Patient Position: Sitting, Cuff Size: Large)    Pulse 88    Temp (!) 96.5 F (35.8 C) (Temporal)    Ht 5' 9.5" (1.765 m)    Wt 224 lb 3.2 oz  (101.7 kg)    LMP 07/18/2021 (Exact Date)    SpO2 97%    BMI 32.63 kg/m   Physical Exam Cardiovascular:     Rate and Rhythm: Normal rate and regular rhythm.     Pulses: Normal pulses.     Heart sounds: Normal heart sounds.  Pulmonary:     Effort: Pulmonary effort is normal.     Breath sounds: Normal breath sounds.  Musculoskeletal:     Right lower leg: No edema.     Left lower leg: No edema.  Neurological:     Mental Status: She is alert and oriented to person, place, and time.  Psychiatric:        Mood and Affect: Mood normal.        Behavior: Behavior normal.        Thought Content: Thought content normal.    Assessment & Plan:  This visit occurred during the SARS-CoV-2 public health emergency.  Safety protocols were in place, including screening questions prior to the visit, additional usage of staff PPE, and extensive cleaning of exam room while observing appropriate contact time as indicated for disinfecting solutions.   Braylin was seen today for establish care.  Diagnoses and all orders for this visit:  Elevated BP without diagnosis of hypertension -     Basic metabolic panel -     TSH -     CBC  History of gestational diabetes -     Hemoglobin A1c  Encounter for lipid screening for cardiovascular disease -     Lipid panel  Problem List Items Addressed This Visit       Other   Elevated BP without diagnosis of hypertension - Primary    First noticed 04/2021: 140/100 and 148/92 by GYN. Asymptomatic. FHx of DM, HTN, CAD and CKD (mother and father) Exercise: burn boot camp, 6mins 3x/week in last 3 months. Diet: admits to high sodium diet Hydration: unknown amount. Hx of gestational DM: 52yrs ago. S/p tubal ligation for contraception No tobacco use ETOH use sparingly No illicit drug use. No caffeine use Admits to increase stress at home. BP Readings from Last 3 Encounters:  08/08/21 138/82  05/18/18 (!) 142/81  11/07/14 134/80  Repeat BP to  improved Check cbc, bmp, tsh We discussed the importance of DASh diet, daily exercise and adequate oral hydration Provided printed information. Advised to monitor BP at home 2-3x/week. Call office if BP persistently >140/80 F/up in 30months      Relevant Orders   Basic metabolic panel   TSH   CBC   History of gestational diabetes    Diagnosed 60yrs ago Check hgbA1c today      Relevant Orders   Hemoglobin A1c   Other Visit Diagnoses     Encounter for lipid screening for cardiovascular disease       Relevant Orders   Lipid panel       Follow-up: Return in about 3 months (around 11/06/2021) for elevated BP.  Kristina Lacy, NP

## 2021-08-08 NOTE — Patient Instructions (Addendum)
Thank you for choosing [facility]  Go to lab for blood draw  Maintain daily exercise Start DASH diet Monitor BP at home 2-3x/week in AM Call office if BP >150/90.  DASH Eating Plan DASH stands for Dietary Approaches to Stop Hypertension. The DASH eating plan is a healthy eating plan that has been shown to: Reduce high blood pressure (hypertension). Reduce your risk for type 2 diabetes, heart disease, and stroke. Help with weight loss. What are tips for following this plan? Reading food labels Check food labels for the amount of salt (sodium) per serving. Choose foods with less than 5 percent of the Daily Value of sodium. Generally, foods with less than 300 milligrams (mg) of sodium per serving fit into this eating plan. To find whole grains, look for the word "whole" as the first word in the ingredient list. Shopping Buy products labeled as "low-sodium" or "no salt added." Buy fresh foods. Avoid canned foods and pre-made or frozen meals. Cooking Avoid adding salt when cooking. Use salt-free seasonings or herbs instead of table salt or sea salt. Check with your health care provider or pharmacist before using salt substitutes. Do not fry foods. Cook foods using healthy methods such as baking, boiling, grilling, roasting, and broiling instead. Cook with heart-healthy oils, such as olive, canola, avocado, soybean, or sunflower oil. Meal planning  Eat a balanced diet that includes: 4 or more servings of fruits and 4 or more servings of vegetables each day. Try to fill one-half of your plate with fruits and vegetables. 6-8 servings of whole grains each day. Less than 6 oz (170 g) of lean meat, poultry, or fish each day. A 3-oz (85-g) serving of meat is about the same size as a deck of cards. One egg equals 1 oz (28 g). 2-3 servings of low-fat dairy each day. One serving is 1 cup (237 mL). 1 serving of nuts, seeds, or beans 5 times each week. 2-3 servings of heart-healthy fats.  Healthy fats called omega-3 fatty acids are found in foods such as walnuts, flaxseeds, fortified milks, and eggs. These fats are also found in cold-water fish, such as sardines, salmon, and mackerel. Limit how much you eat of: Canned or prepackaged foods. Food that is high in trans fat, such as some fried foods. Food that is high in saturated fat, such as fatty meat. Desserts and other sweets, sugary drinks, and other foods with added sugar. Full-fat dairy products. Do not salt foods before eating. Do not eat more than 4 egg yolks a week. Try to eat at least 2 vegetarian meals a week. Eat more home-cooked food and less restaurant, buffet, and fast food. Lifestyle When eating at a restaurant, ask that your food be prepared with less salt or no salt, if possible. If you drink alcohol: Limit how much you use to: 0-1 drink a day for women who are not pregnant. 0-2 drinks a day for men. Be aware of how much alcohol is in your drink. In the [place] one drink equals one 12 oz bottle of beer (355 mL), one 5 oz glass of wine (148 mL), or one 1 oz glass of hard liquor (44 mL). General information Avoid eating more than 2,300 mg of salt a day. If you have hypertension, you may need to reduce your sodium intake to 1,500 mg a day. Work with your health care provider to maintain a healthy body weight or to lose weight. Ask what an ideal weight is for you. Get at least  30 minutes of exercise that causes your heart to beat faster (aerobic exercise) most days of the week. Activities may include walking, swimming, or biking. Work with your health care provider or dietitian to adjust your eating plan to your individual calorie needs. What foods should I eat? Fruits All fresh, dried, or frozen fruit. Canned fruit in natural juice (without added sugar). Vegetables Fresh or frozen vegetables (raw, steamed, roasted, or grilled). Low-sodium or reduced-sodium tomato and vegetable juice. Low-sodium or reduced-sodium  tomato sauce and tomato paste. Low-sodium or reduced-sodium canned vegetables. Grains Whole-grain or whole-wheat bread. Whole-grain or whole-wheat pasta. Brown rice. [place]. Bulgur. Whole-grain and low-sodium cereals. Pita bread. Low-fat, low-sodium crackers. Whole-wheat flour tortillas. Meats and other proteins Skinless chicken or [place]. Ground chicken or [place]. Pork with fat trimmed off. Fish and seafood. Egg whites. Dried beans, peas, or lentils. Unsalted nuts, nut butters, and seeds. Unsalted canned beans. Lean cuts of beef with fat trimmed off. Low-sodium, lean precooked or cured meat, such as sausages or meat loaves. Dairy Low-fat (1%) or fat-free (skim) milk. Reduced-fat, low-fat, or fat-free cheeses. Nonfat, low-sodium ricotta or cottage cheese. Low-fat or nonfat yogurt. Low-fat, low-sodium cheese. Fats and oils Soft margarine without trans fats. Vegetable oil. Reduced-fat, low-fat, or light mayonnaise and salad dressings (reduced-sodium). Canola, safflower, olive, avocado, soybean, and sunflower oils. Avocado. Seasonings and condiments Herbs. Spices. Seasoning mixes without salt. Other foods Unsalted popcorn and pretzels. Fat-free sweets. The items listed above may not be a complete list of foods and beverages you can eat. Contact a dietitian for more information. What foods should I avoid? Fruits Canned fruit in a light or heavy syrup. Fried fruit. Fruit in cream or butter sauce. Vegetables Creamed or fried vegetables. Vegetables in a cheese sauce. Regular canned vegetables (not low-sodium or reduced-sodium). Regular canned tomato sauce and paste (not low-sodium or reduced-sodium). Regular tomato and vegetable juice (not low-sodium or reduced-sodium). Angie Fava. Olives. Grains Baked goods made with fat, such as croissants, muffins, or some breads. Dry pasta or rice meal packs. Meats and other proteins Fatty cuts of meat. Ribs. Fried meat. Berniece Salines. Bologna, salami, and other  precooked or cured meats, such as sausages or meat loaves. Fat from the back of a pig (fatback). Bratwurst. Salted nuts and seeds. Canned beans with added salt. Canned or smoked fish. Whole eggs or egg yolks. Chicken or [place] with skin. Dairy Whole or 2% milk, cream, and half-and-half. Whole or full-fat cream cheese. Whole-fat or sweetened yogurt. Full-fat cheese. Nondairy creamers. Whipped toppings. Processed cheese and cheese spreads. Fats and oils Butter. Stick margarine. Lard. Shortening. Ghee. Bacon fat. Tropical oils, such as coconut, palm kernel, or palm oil. Seasonings and condiments Onion salt, garlic salt, seasoned salt, table salt, and sea salt. Worcestershire sauce. Tartar sauce. Barbecue sauce. Teriyaki sauce. Soy sauce, including reduced-sodium. Steak sauce. Canned and packaged gravies. Fish sauce. Oyster sauce. Cocktail sauce. Store-bought horseradish. Ketchup. Mustard. Meat flavorings and tenderizers. Bouillon cubes. Hot sauces. Pre-made or packaged marinades. Pre-made or packaged taco seasonings. Relishes. Regular salad dressings. Other foods Salted popcorn and pretzels. The items listed above may not be a complete list of foods and beverages you should avoid. Contact a dietitian for more information. Where to find more information National Heart, Lung, and Blood Institute: https://wilson-eaton.com/ American Heart Association: www.heart.org Academy of Nutrition and Dietetics: www.eatright.[place]: www.kidney.org Summary The DASH eating plan is a healthy eating plan that has been shown to reduce high blood pressure (hypertension). It may also reduce your risk  for type 2 diabetes, heart disease, and stroke. When on the DASH eating plan, aim to eat more fresh fruits and vegetables, whole grains, lean proteins, low-fat dairy, and heart-healthy fats. With the DASH eating plan, you should limit salt (sodium) intake to 2,300 mg a day. If you have hypertension, you may need  to reduce your sodium intake to 1,500 mg a day. Work with your health care provider or dietitian to adjust your eating plan to your individual calorie needs. This information is not intended to replace advice given to you by your health care provider. Make sure you discuss any questions you have with your health care provider. Document Revised: 07/16/2019 Document Reviewed: 07/16/2019 Elsevier Patient Education  2022 [employer].

## 2021-08-08 NOTE — Assessment & Plan Note (Signed)
Diagnosed 17yrs ago Check hgbA1c today

## 2021-08-08 NOTE — Assessment & Plan Note (Addendum)
First noticed 04/2021: 140/100 and 148/92 by GYN. Asymptomatic. FHx of DM, HTN, CAD and CKD (mother and father) Exercise: burn boot camp, 78mins 3x/week in last 3 months. Diet: admits to high sodium diet Hydration: unknown amount. Hx of gestational DM: 78yrs ago. S/p tubal ligation for contraception No tobacco use ETOH use sparingly No illicit drug use. No caffeine use Admits to increase stress at home. BP Readings from Last 3 Encounters:  08/08/21 138/82  05/18/18 (!) 142/81  11/07/14 134/80   Repeat BP to improved Check cbc, bmp, tsh We discussed the importance of DASh diet, daily exercise and adequate oral hydration Provided printed information. Advised to monitor BP at home 2-3x/week. Call office if BP persistently >140/80 F/up in 29months

## 2021-11-07 ENCOUNTER — Ambulatory Visit: Payer: 59 | Admitting: Nurse Practitioner

## 2022-04-08 ENCOUNTER — Telehealth: Payer: Self-pay | Admitting: Nurse Practitioner

## 2022-04-08 NOTE — Telephone Encounter (Signed)
Caller Name: Heike Pounds Call back phone #: [phone]  Reason for Call: PT called to schedule appt, PCP is [facility] however scheduled an acute with [facility]. She seemed extremely distressed over the phone. Worried about BP and stressful situation that happened at work. Transferred pt to triage.

## 2022-04-08 NOTE — Progress Notes (Unsigned)
   Acute Office Visit  Subjective:     Patient ID: Kristina Clements, female    DOB: 05/08/1981, 41 y.o.   MRN: [id number]  No chief complaint on file.   HPI Patient is in today for ***  ROS      Objective:    There were no vitals taken for this visit. {Vitals History (Optional):23777}  Physical Exam  No results found for any visits on 04/09/22.      Assessment & Plan:   Problem List Items Addressed This Visit   None   No orders of the defined types were placed in this encounter.   No follow-ups on file.  Charyl Dancer, NP

## 2022-04-09 ENCOUNTER — Ambulatory Visit: Payer: 59 | Admitting: Nurse Practitioner

## 2022-04-09 ENCOUNTER — Encounter: Payer: Self-pay | Admitting: Nurse Practitioner

## 2022-04-09 VITALS — BP 160/90 | HR 99 | Wt 231.0 lb

## 2022-04-09 DIAGNOSIS — F430 Acute stress reaction: Secondary | ICD-10-CM | POA: Diagnosis not present

## 2022-04-09 DIAGNOSIS — R030 Elevated blood-pressure reading, without diagnosis of hypertension: Secondary | ICD-10-CM

## 2022-04-09 NOTE — Assessment & Plan Note (Signed)
BP elevated today at 160/90. Discussed this is most likely related to stress. Encouraged her to keep checking her blood pressure at home. Follow-up in 2 weeks.

## 2022-04-09 NOTE — Assessment & Plan Note (Signed)
She is having an acute anxiety related to stress at work which is increasing her blood pressure. Encouraged her to schedule an appointment with [facility] at her work for counseling. Discussed non-pharmacologic ways to help stress. Work note given to be excused for 2 weeks while talking with Scientist, research (life sciences). Follow-up in 2 weeks.

## 2022-04-09 NOTE — Patient Instructions (Signed)
It was great to see you!  I recommend reaching out to your employer to schedule the [employer] counseling sessions.   We will keep you out of work for 2 weeks.   Let's follow-up in 2 weeks, sooner if you have concerns.  If a referral was placed today, you will be contacted for an appointment. Please note that routine referrals can sometimes take up to 3-4 weeks to process. Please call our office if you haven't heard anything after this time frame.  Take care,  Vance Peper, NP

## 2022-04-09 NOTE — Telephone Encounter (Signed)
Appt scheduled w/ lauren today at 11am

## 2022-04-15 ENCOUNTER — Telehealth: Payer: Self-pay | Admitting: Nurse Practitioner

## 2022-04-15 NOTE — Telephone Encounter (Signed)
Not sure who would be filling out her FMLA. She is calling to know the status of these papers.

## 2022-04-16 ENCOUNTER — Encounter: Payer: Self-pay | Admitting: Nurse Practitioner

## 2022-04-16 NOTE — Telephone Encounter (Signed)
Called and spoke to pt, we have not received FMLA paperwork. Walked pt through sending forms through [employer], [employer]

## 2022-04-16 NOTE — Telephone Encounter (Signed)
FMLA PAPERWORK COMPLETE AND FAXED TO [employer].

## 2022-04-17 ENCOUNTER — Encounter: Payer: Self-pay | Admitting: Nurse Practitioner

## 2022-04-23 ENCOUNTER — Encounter: Payer: Self-pay | Admitting: Nurse Practitioner

## 2022-04-23 ENCOUNTER — Ambulatory Visit: Payer: 59 | Admitting: Nurse Practitioner

## 2022-04-23 VITALS — BP 142/90 | HR 80 | Temp 97.7°F | Wt 235.4 lb

## 2022-04-23 DIAGNOSIS — R030 Elevated blood-pressure reading, without diagnosis of hypertension: Secondary | ICD-10-CM

## 2022-04-23 DIAGNOSIS — F430 Acute stress reaction: Secondary | ICD-10-CM | POA: Diagnosis not present

## 2022-04-23 NOTE — Telephone Encounter (Signed)
Paperwork received on 04/23/22   Type of paperwork: Mental status supplement form  Route received: Patient left at office   Has patient completed their portion of the paperwork: yes   Has office staff updated demographics of paperwork: yes   Paperwork routed to provider : Placed on desk for review

## 2022-04-23 NOTE — Patient Instructions (Signed)
It was great to see you!  I will extend your leave another week, let me know if you need any other paper work completed. Plan for your return to work date 05/06/22.   Let's follow-up if your symptoms worsen or any concerns.   Take care,  Vance Peper, NP

## 2022-04-23 NOTE — Assessment & Plan Note (Signed)
BP is elevated today at 142/90. She has been working on her stress which is improving. Encouraged her to continue checking her blood pressure at home, exercise, and limit salt in her diet. May need to consider medication if BP remains elevated.

## 2022-04-23 NOTE — Assessment & Plan Note (Signed)
Her stress has significantly improved since her last visit. She is talking with a therapist and will be seeing them every Wednesday. She has been taking walks, deep breathing, and praying. She would like a little more time off of work. Will extend her leave for another week and she can go back to work 05/06/22. Continue working with therapist.

## 2022-04-23 NOTE — Progress Notes (Signed)
   Established Patient Office Visit  Subjective   Patient ID: Kristina Clements, female    DOB: 05/08/1981  Age: 41 y.o. MRN: [id number]  Chief Complaint  Patient presents with   Follow-up    2 wk f/u elevated BP.     HPI  [name] is here today to follow-up on stress and anxiety. She state that she has seen a therapist, has been going for daily walks, praying, and going back to church which has helped with her acute stress. She has another appointment with the therapist tomorrow and next Wednesday. She is requesting a little more time off of work to continue with her therapist.   She has been checking her blood pressure at home. It was 138/88 in the mornings at home. BP tends to be lower at night and is 120s/70s. She denies chest pain, shortness of breath, and headaches.     ROS See pertinent positives and negatives per HPI.    Objective:     BP (!) 142/90 (BP Location: Right Arm, Cuff Size: Large)   Pulse 80   Temp 97.7 F (36.5 C) (Temporal)   Wt 235 lb 6.4 oz (106.8 kg)   SpO2 98%   BMI 34.26 kg/m    Physical Exam Vitals and nursing note reviewed.  Constitutional:      General: She is not in acute distress.    Appearance: Normal appearance.  HENT:     Head: Normocephalic.  Eyes:     Conjunctiva/sclera: Conjunctivae normal.  Cardiovascular:     Rate and Rhythm: Normal rate and regular rhythm.     Pulses: Normal pulses.     Heart sounds: Normal heart sounds.  Pulmonary:     Effort: Pulmonary effort is normal.     Breath sounds: Normal breath sounds.  Musculoskeletal:     Cervical back: Normal range of motion.  Skin:    General: Skin is warm.  Neurological:     General: No focal deficit present.     Mental Status: She is alert and oriented to person, place, and time.  Psychiatric:        Mood and Affect: Mood normal.        Behavior: Behavior normal.        Thought Content: Thought content normal.        Judgment: Judgment normal.      Assessment &  Plan:   Problem List Items Addressed This Visit       Other   Elevated BP without diagnosis of hypertension    BP is elevated today at 142/90. She has been working on her stress which is improving. Encouraged her to continue checking her blood pressure at home, exercise, and limit salt in her diet. May need to consider medication if BP remains elevated.       Acute stress reaction - Primary    Her stress has significantly improved since her last visit. She is talking with a therapist and will be seeing them every Wednesday. She has been taking walks, deep breathing, and praying. She would like a little more time off of work. Will extend her leave for another week and she can go back to work 05/06/22. Continue working with therapist.        Return if symptoms worsen or fail to improve.    Charyl Dancer, NP

## 2022-05-06 ENCOUNTER — Ambulatory Visit: Payer: 59 | Admitting: Family Medicine

## 2023-11-26 ENCOUNTER — Telehealth: Payer: Self-pay | Admitting: Nurse Practitioner

## 2023-11-26 NOTE — Telephone Encounter (Signed)
 [facility] to schedule appt if needed.
